# Patient Record
Sex: Male | Born: 1950 | Race: White | Hispanic: No | Marital: Married | Smoking: Never smoker
Health system: Southern US, Community
[De-identification: ages and names within clinical notes are randomized; demographics above are authoritative.]

## PROBLEM LIST (undated history)

## (undated) DIAGNOSIS — C801 Malignant (primary) neoplasm, unspecified: Secondary | ICD-10-CM

## (undated) DIAGNOSIS — K219 Gastro-esophageal reflux disease without esophagitis: Secondary | ICD-10-CM

## (undated) DIAGNOSIS — M1990 Unspecified osteoarthritis, unspecified site: Secondary | ICD-10-CM

## (undated) DIAGNOSIS — G4730 Sleep apnea, unspecified: Secondary | ICD-10-CM

## (undated) DIAGNOSIS — A159 Respiratory tuberculosis unspecified: Secondary | ICD-10-CM

---

## 1998-08-01 ENCOUNTER — Emergency Department (HOSPITAL_COMMUNITY): Admission: EM | Admit: 1998-08-01 | Discharge: 1998-08-01 | Payer: Self-pay | Admitting: Emergency Medicine

## 1998-08-01 ENCOUNTER — Encounter: Payer: Self-pay | Admitting: Emergency Medicine

## 1999-02-13 HISTORY — PX: KNEE ARTHROSCOPY: SHX127

## 2003-12-10 ENCOUNTER — Encounter (INDEPENDENT_AMBULATORY_CARE_PROVIDER_SITE_OTHER): Payer: Self-pay | Admitting: *Deleted

## 2003-12-10 ENCOUNTER — Ambulatory Visit (HOSPITAL_COMMUNITY): Admission: RE | Admit: 2003-12-10 | Discharge: 2003-12-10 | Payer: Self-pay | Admitting: Gastroenterology

## 2006-07-01 ENCOUNTER — Ambulatory Visit: Payer: Self-pay | Admitting: Internal Medicine

## 2006-07-18 ENCOUNTER — Ambulatory Visit (HOSPITAL_BASED_OUTPATIENT_CLINIC_OR_DEPARTMENT_OTHER): Admission: RE | Admit: 2006-07-18 | Discharge: 2006-07-18 | Payer: Self-pay | Admitting: Internal Medicine

## 2006-07-21 ENCOUNTER — Ambulatory Visit: Payer: Self-pay | Admitting: Internal Medicine

## 2006-08-19 ENCOUNTER — Ambulatory Visit: Payer: Self-pay | Admitting: Internal Medicine

## 2007-06-23 ENCOUNTER — Telehealth: Payer: Self-pay | Admitting: Internal Medicine

## 2007-06-24 DIAGNOSIS — G4733 Obstructive sleep apnea (adult) (pediatric): Secondary | ICD-10-CM | POA: Insufficient documentation

## 2007-06-30 DIAGNOSIS — J309 Allergic rhinitis, unspecified: Secondary | ICD-10-CM | POA: Insufficient documentation

## 2007-07-08 ENCOUNTER — Encounter: Payer: Self-pay | Admitting: Internal Medicine

## 2007-07-15 ENCOUNTER — Encounter: Payer: Self-pay | Admitting: Internal Medicine

## 2007-07-23 ENCOUNTER — Encounter: Payer: Self-pay | Admitting: Internal Medicine

## 2007-10-10 ENCOUNTER — Encounter: Payer: Self-pay | Admitting: Internal Medicine

## 2007-12-22 ENCOUNTER — Encounter: Payer: Self-pay | Admitting: Internal Medicine

## 2008-03-09 ENCOUNTER — Encounter: Payer: Self-pay | Admitting: Internal Medicine

## 2008-08-23 ENCOUNTER — Encounter: Payer: Self-pay | Admitting: Internal Medicine

## 2010-06-27 NOTE — Assessment & Plan Note (Signed)
[facility]                             PULMONARY OFFICE NOTE   NAME:Jason Barber, Jason Barber                      MRN:          [id number]  DATE:08/19/2006                            DOB:          08-18-1950    PULMONARY OFFICE FOLLOWUP   PROBLEMS:  1. Obstructive sleep apnea.  2. Irregular sleep schedule.  3. Status post palatoplasty.  4. History of allergic rhinitis.   HISTORY:  He returns after a sleep study done July 18, 2006, which  demonstrated severe obstructive apnea with an index of 63.4 per hour.  CPAP titrated to 16 CWP but bets control seemed to be at pressures of 11  and 12 CWP for an index of 2.6 to 3.6.  He did not tolerate CPAP well  initially.  He tells me he has not been using Rozerem because he  dislikes pills.  We discussed sleep apnea, went over the graphs from  his study, and discussed treatment alternatives.  He is willing to give  CPAP a trial at home supplemented with temazepam if needed to help the  sleep.   OBJECTIVE:  Weight 267 pounds, BP 104/78, pulse 66, room air saturation  96%.  He seems a little anxious.  Pulse is regular.  There is no tremor.  Nasal airway is not obstructed.   IMPRESSION:  Obstructive sleep apnea, severe.   PLAN:  1. Weight loss.  2. Sleep hygiene reviewed.  3. Try home CPAP 12 CWP.  4. Temazepam 15 mg 1 or 2 at bedtime p.r.n. during the first 2 weeks      of CPAP trial.  5. Schedule return in 1 month, earlier p.r.n.     Clinton D. Maple Hudson, MD, Tonny Bollman, FACP  Electronically Signed    CDY/MedQ  DD: 08/19/2006  DT: 08/20/2006  Job #: [id number]   cc:   Marjory Lies, M.D.

## 2010-06-27 NOTE — Procedures (Signed)
Jason Barber, Jason Barber               ACCOUNT NO.:  [id number]   MEDICAL RECORD NO.:  [id number]          PATIENT TYPE:  OUT   LOCATION:  SLEEP CENTER                 FACILITY:  [facility]   PHYSICIAN:  Clinton D. Maple Hudson, MD, FCCP, FACPDATE OF BIRTH:  1950/03/01   DATE OF STUDY:  07/18/2006                            NOCTURNAL POLYSOMNOGRAM   REFERRING PHYSICIAN:  Clinton D. Young, MD, FCCP, FACP   INDICATIONS FOR PROCEDURE:  Hypersomnia with sleep apnea.   RESULTS:  Epward sleepiness score 17/24, BMI 33, weight 260 pounds.   MEDICATIONS:  No routine medications.   SLEEP ARCHITECTURE:  Total sleep time 342 minutes with sleep efficiency  88%. Stage 1 was 11%; Stage 2, 66%; Stages 3 and 4, 9%. REM 15% of total  sleep time. Sleep latency 6 minutes, REM latency 85 minutes. Awake after  sleep onset 39 minutes. Arousal index 25.1. Rozerem 8 mg taken at  bedtime.   RESPIRATORY DATA:  Split study protocol. Apnea hypopnea index (AHI, RDI)  63.4 obstructive events per hour, indicating severe obstructive sleep  apnea/hypopnea syndrome before CPAP. There were 31 obstructive apneas, 4  central apneas, 1 mixed apnea, and 102 hypopnea's before CPAP. Events  were positional with most recorded while supine and most sleep was  supine. REM AHI 63 per hour. CPAP was titrated to 16 CWP, AHI 5.7 per  hour. Best control was at pressures of 11 and 12 CWP (AHI 2.6 to 3.6.) A  medium comfort Fusion mask was used with heated humidifier. The patient  actively disliked wearing anything on his face.   OXYGEN DATA:  Moderate snoring with oxygen desaturation to a nadir of  84%. After CPAP control, saturation held 94% on room air.   CARDIAC DATA:  Normal sinus rhythm.   MOVEMENT/PARASOMNIA:  Insignificant movement disturbance.   IMPRESSION/RECOMMENDATIONS:  1. Unremarkable sleep architecture for sleep center environment after      8 mg of Rozerem.  2. Severe obstructive sleep apnea/hypopnea syndrome, AHI 63.4 per  hour      with most events and most sleep recorded while supine. Moderate      snoring with oxygen desaturation to a nadir of 84%.  3. CPAP titration to 16 CWP, AHI 5.7 per hour. Best numerical control      seemed available at pressures of 11 to 12      CWP (AHI 2.6 to 3.6.) A medium comfort Fusion mask was chosen with      heated humidifier. The patient's intolerance of CPAP was poor on      initial presentation.      Clinton D. Maple Hudson, MD, [facility], FACP  Diplomate, Biomedical engineer of Sleep Medicine  Electronically Signed     CDY/MEDQ  D:  07/21/2006 11:50:44  T:  07/21/2006 21:26:06  Job:  [id number]

## 2010-06-27 NOTE — Assessment & Plan Note (Signed)
[facility]                             PULMONARY OFFICE NOTE   NAME:Jason Barber, Jason Barber                      MRN:          [id number]  DATE:07/01/2006                            DOB:          [birth year]    SLEEP MEDICINE CONSULTATION:   PROBLEM:  A 60 year old gentleman referred through the courtesy of Dr.  Doristine Counter in sleep medicine consultation, concerned about sleep-disordered  breathing.   HISTORY:  He reports a history of loud, constant snoring and witnessed  apneas.  Palatoplasty provided only temporary help years ago.  Bedtime  around 11 p.m. with very short sleep latency.  He thinks he wakes  several times during the night and shifts position but understands that  it does not matter what position he sleeps in.  He is up around 7 a.m.  Weight fluctuates some.  He has never had a regular stable sleep  schedule.   MEDICATIONS:  None.   No medication allergy.   REVIEW OF SYSTEMS:  Snoring and witnessed apnea.  Some shifting and  tightness of the legs, which he says sometimes is because of pain in the  legs.  Occasional use of sleeping pills.  Nonrestorative sleep.  He will  nap anytime/anywhere if he is sitting quietly.  Some depression.   PAST HISTORY:  1. Septoplasty and palatoplasty by Dr. Haroldine Laws around 1999.  2. Nasal congestion and seasonal allergies.  3. Sleep apnea.   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  He had knee surgery in  2004.   SOCIAL HISTORY:  Occasional alcohol, never coffee.  Denies smoking.  Married.  Worked as a Nurse, adult and now a Photographer.   FAMILY HISTORY:  Mother with emphysema.  Brother with asthma.  Father  loud snorer.   OBJECTIVE:  VITAL SIGNS:  Weight 260 pounds, BP 128/86, pulse 75, room  air saturation 96%.  GENERAL:  This is a tall man, 6 feet 2 inches.  He is alert and seems  comfortable.  NEUROLOGIC:   Appropriate to observation.  HEENT:  Palate spacing 3-4/4.  Nasal airway is not obstructed.  Thyroid  is not prominent.  There is no stridor.  CHEST:  Clear.  CARDIAC:  Heart sounds regular without murmur.  No restlessness or  tremor.   IMPRESSION:  1. Obstructive sleep apnea.  2. Long history of irregular sleep schedule.  3. Status post palatoplasty.  4. History of allergic rhinitis.   PLAN:  We are scheduling a split-protocol sleep study at the [facility]  center, and he will return after that for follow-up.  I have begun  discussion of sleep apnea, sleep hygiene, and his responsibility to stay  alert for driving.   I appreciate the chance to meet him.     Clinton D. Maple Hudson, MD, Tonny Bollman, FACP  Electronically Signed    CDY/MedQ  DD: 07/08/2006  DT: 07/08/2006  Job #: [id number]   cc:   Marjory Lies, M.D.  [facility]

## 2010-06-30 NOTE — Op Note (Signed)
NAMENAVEEN, CLARDY               ACCOUNT NO.:  [id number]   MEDICAL RECORD NO.:  [id number]          PATIENT TYPE:  AMB   LOCATION:  ENDO                         FACILITY:  [facility]   PHYSICIAN:  Anselmo Rod, M.D.  DATE OF BIRTH:  May 25, 1950   DATE OF PROCEDURE:  12/10/2003  DATE OF DISCHARGE:                                 OPERATIVE REPORT   PROCEDURE PERFORMED:  Colonoscopy with biopsies x 2 (cold biopsies).   ENDOSCOPIST:  Charna Elizabeth, M.D.   INSTRUMENT USED:  Olympus video colonoscope.   INDICATIONS FOR PROCEDURE:  The patient is a 60 year old white male with a  history of guaiac positive stools, undergoing screening colonoscopy.  Rule  out colonic polyps, masses, etc.   PREPROCEDURE PREPARATION:  Informed consent was procured from the patient.  The patient was fasted for eight hours prior to the procedure and prepped  with a bottle of magnesium citrate and a gallon of GoLYTELY the night prior  to the procedure.   PREPROCEDURE PHYSICAL:  The patient had stable vital signs.  Neck supple.  Chest clear to auscultation.  S1 and S2 regular.  Abdomen soft with normal  bowel sounds.   DESCRIPTION OF PROCEDURE:  The patient was placed in left lateral decubitus  position and sedated with 75 mg of Demerol and 9 mg of Versed in slow  incremental doses.  Once the patient was adequately sedated and maintained  on low flow oxygen and continuous cardiac monitoring, the Olympus video  colonoscope was advanced from the rectum to the cecum.  The appendicular  orifice and ileocecal valve were clearly visualized and photographed.  A  small patch of erythematous mucosa was biopsied from 30 cm.  A few early  sigmoid diverticula were seen in the left colon.  The rest of the exam was  unrevealing.  Retroflexion in the rectum revealed no abnormalities.   IMPRESSION:  1.  Small patch of erythema biopsied at 30 mL.  2.  Few early sigmoid diverticula.  3.  Otherwise unrevealing colonoscopy to  the terminal ileum.   RECOMMENDATIONS:  1.  Await pathology results.  2.  Avoid all nonsteroidals including aspirin for now.  3.  Outpatient follow-up for repeat guaiacs in two weeks.  4.  A high fiber diet with liberal fluid intake has been advocated.  Further      recommendations will be made in follow-up.       JNM/MEDQ  D:  12/10/2003  T:  12/10/2003  Job:  [id number]   cc:   Marjory Lies, M.D.  [address]  [place]  [place] [address]  Fax: [phone]

## 2016-02-17 DIAGNOSIS — F5102 Adjustment insomnia: Secondary | ICD-10-CM | POA: Diagnosis not present

## 2016-02-17 DIAGNOSIS — K219 Gastro-esophageal reflux disease without esophagitis: Secondary | ICD-10-CM | POA: Diagnosis not present

## 2016-04-17 DIAGNOSIS — Z1211 Encounter for screening for malignant neoplasm of colon: Secondary | ICD-10-CM | POA: Diagnosis not present

## 2016-04-17 DIAGNOSIS — K219 Gastro-esophageal reflux disease without esophagitis: Secondary | ICD-10-CM | POA: Diagnosis not present

## 2016-04-17 DIAGNOSIS — K5730 Diverticulosis of large intestine without perforation or abscess without bleeding: Secondary | ICD-10-CM | POA: Diagnosis not present

## 2016-05-02 DIAGNOSIS — K5730 Diverticulosis of large intestine without perforation or abscess without bleeding: Secondary | ICD-10-CM | POA: Diagnosis not present

## 2016-05-02 DIAGNOSIS — Z1211 Encounter for screening for malignant neoplasm of colon: Secondary | ICD-10-CM | POA: Diagnosis not present

## 2016-06-06 DIAGNOSIS — G4733 Obstructive sleep apnea (adult) (pediatric): Secondary | ICD-10-CM | POA: Diagnosis not present

## 2016-07-06 DIAGNOSIS — G4733 Obstructive sleep apnea (adult) (pediatric): Secondary | ICD-10-CM | POA: Diagnosis not present

## 2016-08-06 DIAGNOSIS — G4733 Obstructive sleep apnea (adult) (pediatric): Secondary | ICD-10-CM | POA: Diagnosis not present

## 2016-08-20 ENCOUNTER — Ambulatory Visit (INDEPENDENT_AMBULATORY_CARE_PROVIDER_SITE_OTHER): Payer: PPO | Admitting: Internal Medicine

## 2016-08-20 ENCOUNTER — Encounter: Payer: Self-pay | Admitting: Internal Medicine

## 2016-08-20 VITALS — BP 134/86 | HR 83 | Ht 74.0 in | Wt 279.4 lb

## 2016-08-20 DIAGNOSIS — G4733 Obstructive sleep apnea (adult) (pediatric): Secondary | ICD-10-CM

## 2016-08-20 DIAGNOSIS — F5101 Primary insomnia: Secondary | ICD-10-CM | POA: Diagnosis not present

## 2016-08-20 MED ORDER — ZOLPIDEM TARTRATE 10 MG PO TABS: 10.0000 mg | ORAL_TABLET | Freq: Every evening | ORAL | 5 refills | Status: DC | PRN

## 2016-08-20 NOTE — Patient Instructions (Signed)
Order DME [facility]- Please raise CPAP pressure to 14, continue mask of choice, supplies, AirView   Dx OSA   Please call as needed. We can change the pressure again if needed.

## 2016-08-20 NOTE — Progress Notes (Signed)
08/20/16-66 year old male with history of obstructive sleep apnea coming to reestablish. Last here in 2012. History of septoplasty without lasting benefit. Loud snoring, witnessed apneas. Nasal congestion and seasonal allergies. 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 Today 279 pounds. NPSG 07/18/06- AHI 63.4/hour, desaturation to 84%, CPAP titration to 16, body weight 260 pounds. Pt is here for a sleep consult. DME: [facility]. Pt already has a CPAP machine but it is not working well. Pt states he needs to have the pressure upped. Pt takes ambien to help him sleep due to the CPAP machine not working. Download 83% 4 hour compliance with CPAP 12/Advanced, AHI 0.5/hour. Machine is new. Fullface mask. Does not use humidifier. He travels with another machine, explaining gaps in this download log. Epworth score 12/24 He complains of difficulty initiating and maintaining sleep. Ambien 1/210 mg tab every night. He thinks he won't sleep without it. Denies heart or lung disease.  Prior to Admission medications   Medication Sig Start Date End Date Taking? Authorizing Provider  esomeprazole (NEXIUM) 20 MG capsule Take 20 mg by mouth 2 (two) times daily. Pt states that most of the time he only takes one 20mg  a day instead of bid.   Yes [provider]  zolpidem (AMBIEN) 10 MG tablet Take 1 tablet (10 mg total) by mouth at bedtime as needed for sleep. 08/20/16  Yes Deneise Lever, MD   No past medical history on file. No past surgical history on file. No family history on file. Social History   Social History  . Marital status: Married    Spouse name: N/A  . Number of children: N/A  . Years of education: N/A   Occupational History  . Not on file.   Social History Main Topics  . Smoking status: Never Smoker  . Smokeless tobacco: Never Used  . Alcohol use Not on file  . Drug use: Unknown  . Sexual activity: Not on file   Other Topics  Concern  . Not on file   Social History Narrative  . No narrative on file   ROS-see HPI   Negative unless "+" Constitutional:    weight loss, night sweats, fevers, chills, fatigue, lassitude. HEENT:    headaches, difficulty swallowing, tooth/dental problems, sore throat,       sneezing, itching, ear ache, nasal congestion, post nasal drip, snoring CV:    chest pain, orthopnea, PND, swelling in lower extremities, anasarca,                                                     dizziness, palpitations Resp:   shortness of breath with exertion or at rest.                productive cough,   non-productive cough, coughing up of blood.              change in color of mucus.  wheezing.   Skin:    rash or lesions. GI:  No-   heartburn, indigestion, abdominal pain, nausea, vomiting, diarrhea,                 change in bowel habits, loss of appetite GU: dysuria, change in color of urine, no urgency or frequency.   flank pain. MS:  joint pain, stiffness, decreased range of motion, back pain. Neuro-     nothing unusual Psych:  change in mood or affect.  depression or anxiety.   memory loss.  OBJ- Physical Exam General- Alert, Oriented, Affect-appropriate, Distress- none acute, + tall Skin- + indurated areas on back of neck/sun exposed Lymphadenopathy- none Head- atraumatic            Eyes- Gross vision intact, PERRLA, conjunctivae and secretions clear            Ears- Hearing, canals-normal            Nose- Clear, no-Septal dev, mucus, polyps, erosion, perforation             Throat- Mallampati IV , mucosa clear , drainage- none, tonsils- atrophic Neck- flexible , trachea midline, no stridor , thyroid nl, carotid no bruit Chest - symmetrical excursion , unlabored           Heart/CV- RRR , no murmur , no gallop  , no rub, nl s1 s2                           - JVD- none , edema- none, stasis changes- none, varices- none           Lung- clear to P&A, wheeze- none, cough- none , dullness-none, rub-  none           Chest wall-  Abd-  Br/ Gen/ Rectal- Not done, not indicated Extrem- cyanosis- none, clubbing, none, atrophy- none, strength- nl Neuro- grossly intact to observation

## 2016-08-22 DIAGNOSIS — G4700 Insomnia, unspecified: Secondary | ICD-10-CM | POA: Insufficient documentation

## 2016-08-22 NOTE — Assessment & Plan Note (Signed)
He is used to taking Ambien, usually 5 mg, he thinks he will sleep without it. We discussed this and began exploring ways to get away from the habit.

## 2016-08-22 NOTE — Assessment & Plan Note (Signed)
Good compliance and control with CPAP. Machine is new. He would like to try a higher pressure although he is getting good control at 12. Plan-increased to 14

## 2016-09-05 DIAGNOSIS — G4733 Obstructive sleep apnea (adult) (pediatric): Secondary | ICD-10-CM | POA: Diagnosis not present

## 2016-09-08 DIAGNOSIS — G4733 Obstructive sleep apnea (adult) (pediatric): Secondary | ICD-10-CM | POA: Diagnosis not present

## 2016-09-20 ENCOUNTER — Telehealth: Payer: Self-pay | Admitting: Internal Medicine

## 2016-09-20 DIAGNOSIS — G4733 Obstructive sleep apnea (adult) (pediatric): Secondary | ICD-10-CM

## 2016-09-20 NOTE — Telephone Encounter (Signed)
Ok order CPAP pressure set back to 12 please   Dx OSA

## 2016-09-20 NOTE — Telephone Encounter (Signed)
CY  Please Advise-  Pt called and stated his new pressure of 14 is not working for him, he states it is hard for him to have a good seal. He wanted to know if he could have his pressure either changed to 12 or set at 12-14

## 2016-09-20 NOTE — Telephone Encounter (Signed)
Called and spoke with pt and he is aware of order sent to [facility] to change the pressure of the cpap to 12.

## 2016-10-06 DIAGNOSIS — G4733 Obstructive sleep apnea (adult) (pediatric): Secondary | ICD-10-CM | POA: Diagnosis not present

## 2016-11-06 DIAGNOSIS — G4733 Obstructive sleep apnea (adult) (pediatric): Secondary | ICD-10-CM | POA: Diagnosis not present

## 2016-11-27 ENCOUNTER — Telehealth: Payer: Self-pay | Admitting: Internal Medicine

## 2016-11-27 NOTE — Telephone Encounter (Signed)
I now have a copy of this CMN and it was faxed to [place] this morning

## 2016-11-27 NOTE — Telephone Encounter (Signed)
Rodena Piety did you receive?

## 2016-11-27 NOTE — Telephone Encounter (Signed)
I have checked Dr. Janee Morn CMN folder and I don't have anything on this patient

## 2016-11-28 NOTE — Telephone Encounter (Signed)
Will route to [facility] for follow up.

## 2016-11-28 NOTE — Telephone Encounter (Signed)
Dr. Annamaria Boots has signed this CMN and it has been faxed to [facility] today

## 2016-12-06 DIAGNOSIS — G4733 Obstructive sleep apnea (adult) (pediatric): Secondary | ICD-10-CM | POA: Diagnosis not present

## 2016-12-11 DIAGNOSIS — G4733 Obstructive sleep apnea (adult) (pediatric): Secondary | ICD-10-CM | POA: Diagnosis not present

## 2017-01-06 DIAGNOSIS — G4733 Obstructive sleep apnea (adult) (pediatric): Secondary | ICD-10-CM | POA: Diagnosis not present

## 2017-02-05 DIAGNOSIS — G4733 Obstructive sleep apnea (adult) (pediatric): Secondary | ICD-10-CM | POA: Diagnosis not present

## 2017-03-08 DIAGNOSIS — G4733 Obstructive sleep apnea (adult) (pediatric): Secondary | ICD-10-CM | POA: Diagnosis not present

## 2017-03-22 DIAGNOSIS — G4733 Obstructive sleep apnea (adult) (pediatric): Secondary | ICD-10-CM | POA: Diagnosis not present

## 2017-04-08 DIAGNOSIS — G4733 Obstructive sleep apnea (adult) (pediatric): Secondary | ICD-10-CM | POA: Diagnosis not present

## 2017-04-22 DIAGNOSIS — F5102 Adjustment insomnia: Secondary | ICD-10-CM | POA: Diagnosis not present

## 2017-04-22 DIAGNOSIS — K219 Gastro-esophageal reflux disease without esophagitis: Secondary | ICD-10-CM | POA: Diagnosis not present

## 2017-05-06 DIAGNOSIS — G4733 Obstructive sleep apnea (adult) (pediatric): Secondary | ICD-10-CM | POA: Diagnosis not present

## 2017-06-06 DIAGNOSIS — G4733 Obstructive sleep apnea (adult) (pediatric): Secondary | ICD-10-CM | POA: Diagnosis not present

## 2017-06-21 DIAGNOSIS — G4733 Obstructive sleep apnea (adult) (pediatric): Secondary | ICD-10-CM | POA: Diagnosis not present

## 2017-08-20 ENCOUNTER — Ambulatory Visit: Payer: PPO | Admitting: Internal Medicine

## 2017-09-26 DIAGNOSIS — G4733 Obstructive sleep apnea (adult) (pediatric): Secondary | ICD-10-CM | POA: Diagnosis not present

## 2017-11-19 DIAGNOSIS — K219 Gastro-esophageal reflux disease without esophagitis: Secondary | ICD-10-CM | POA: Diagnosis not present

## 2017-11-19 DIAGNOSIS — B009 Herpesviral infection, unspecified: Secondary | ICD-10-CM | POA: Diagnosis not present

## 2017-11-19 DIAGNOSIS — F5102 Adjustment insomnia: Secondary | ICD-10-CM | POA: Diagnosis not present

## 2018-01-01 DIAGNOSIS — G4733 Obstructive sleep apnea (adult) (pediatric): Secondary | ICD-10-CM | POA: Diagnosis not present

## 2018-02-26 DIAGNOSIS — Z0000 Encounter for general adult medical examination without abnormal findings: Secondary | ICD-10-CM | POA: Diagnosis not present

## 2018-04-07 DIAGNOSIS — G4733 Obstructive sleep apnea (adult) (pediatric): Secondary | ICD-10-CM | POA: Diagnosis not present

## 2018-06-12 DIAGNOSIS — G4733 Obstructive sleep apnea (adult) (pediatric): Secondary | ICD-10-CM | POA: Diagnosis not present

## 2018-09-10 DIAGNOSIS — G4733 Obstructive sleep apnea (adult) (pediatric): Secondary | ICD-10-CM | POA: Diagnosis not present

## 2018-11-21 DIAGNOSIS — N529 Male erectile dysfunction, unspecified: Secondary | ICD-10-CM | POA: Diagnosis not present

## 2019-07-20 ENCOUNTER — Ambulatory Visit (INDEPENDENT_AMBULATORY_CARE_PROVIDER_SITE_OTHER): Payer: No Typology Code available for payment source | Admitting: Orthopaedic Surgery

## 2019-07-20 ENCOUNTER — Ambulatory Visit (INDEPENDENT_AMBULATORY_CARE_PROVIDER_SITE_OTHER): Payer: No Typology Code available for payment source

## 2019-07-20 VITALS — Ht 74.0 in | Wt 230.0 lb

## 2019-07-20 DIAGNOSIS — M1611 Unilateral primary osteoarthritis, right hip: Secondary | ICD-10-CM | POA: Insufficient documentation

## 2019-07-20 DIAGNOSIS — M25551 Pain in right hip: Secondary | ICD-10-CM

## 2019-07-20 DIAGNOSIS — M25559 Pain in unspecified hip: Secondary | ICD-10-CM | POA: Diagnosis not present

## 2019-07-20 NOTE — Progress Notes (Signed)
Office Visit Note   Patient: Jason Barber           Date of Birth: 05/24/1950           MRN: [id number] Visit Date: 07/20/2019              Requested by: No referring provider defined for this encounter. PCP: Patient, No Pcp Per   Assessment & Plan: Visit Diagnoses:  1. Hip pain   2. Unilateral primary osteoarthritis, right hip     Plan: At this point given the severity of his right hip pain and the detrimental effect this is having on his mobility and his quality of life, he does wish to proceed with hip replacement surgery.  Also given his x-ray and clinical exam findings I agree with this.  We talked in detail about what the surgery involves.  I described his interoperative and postoperative course.  I gave him a handout about hip replacement surgery.  We went over his x-rays and a hip replacement model.  We talked about the risk and benefits of surgery in detail.  All questions and concerns were answered and addressed.  He is interested in having this scheduled in the near future.  Follow-Up Instructions: Return for 2 weeks post-op.   Orders:  Orders Placed This Encounter  Procedures  . XR HIP UNILAT W OR W/O PELVIS 1V RIGHT   No orders of the defined types were placed in this encounter.     Procedures: No procedures performed   Clinical Data: No additional findings.   Subjective: Chief Complaint  Patient presents with  . Right Hip - Pain  The patient is a very pleasant and active 69 year old gentleman referred from the [place] system to evaluate and treat severe arthritis involving his right hip.  He has been having worsening hip pain for several years.  Has had several injections in the hip joint and on the side of the hip.  At this point he cannot sleep due to pain.  He reports severe pain in the groin.  It has been worsening.  At this point it is 10 out of 10 daily and is definitely affecting his mobility, his quality of life, and his activities daily living.  He  does walk with a significant limp.  He is a Therapist, nutritional.  Pivoting activities cause severe pain.  Has been sitting for a while and gets to stand up or get out of the car he has severe pain in the groin and is having problems mobilizing.  He is work on activity modification and weight loss.  He takes anti-inflammatories as needed and occasionally has walk with a cane in his opposite hand.  This is been going on for several years.  HPI  Review of Systems He currently denies any headache, chest pain, shortness of breath, fever, chills, nausea, vomiting  Objective: Vital Signs: Ht 6\' 2"  (1.88 m)   Wt 230 lb (104.3 kg)   BMI 29.53 kg/m   Physical Exam He is alert and orient x3 and in no acute distress Ortho Exam  examination of his left hip is normal, examination of his right hip shows severe pain with internal and external rotation. He walks with a Trendelenburg gait and has significant stiffness with attempts of rotating of his hip. Specialty Comments:  No specialty comments available.  Imaging: XR HIP UNILAT W OR W/O PELVIS 1V RIGHT  Result Date: 07/20/2019 An AP pelvis and lateral right hip shows some joint space narrowing  as well as flattening of the superior lateral femoral head.  There is also para-articular osteophytes.  There is cystic changes in the head as well.    PMFS History: Patient Active Problem List   Diagnosis Date Noted  . Unilateral primary osteoarthritis, right hip 07/20/2019  . Insomnia 08/22/2016  . ALLERGIC RHINITIS 06/30/2007  . Obstructive sleep apnea 06/24/2007   No past medical history on file.  No family history on file.   Social History   Occupational History  . Not on file  Tobacco Use  . Smoking status: Never Smoker  . Smokeless tobacco: Never Used  Substance and Sexual Activity  . Alcohol use: Not on file  . Drug use: Not on file  . Sexual activity: Not on file

## 2019-07-24 ENCOUNTER — Other Ambulatory Visit: Payer: Self-pay

## 2019-08-05 ENCOUNTER — Other Ambulatory Visit: Payer: Self-pay | Admitting: Physician Assistant

## 2019-08-06 NOTE — Patient Instructions (Addendum)
DUE TO COVID-19 ONLY ONE VISITOR IS ALLOWED TO COME WITH YOU AND STAY IN THE WAITING ROOM ONLY DURING PRE OP AND PROCEDURE DAY OF SURGERY. THE 2 VISITORS  MAY VISIT WITH YOU AFTER SURGERY IN YOUR PRIVATE ROOM DURING VISITING HOURS ONLY!  YOU NEED TO HAVE A COVID 19 TEST ON_6/29______ @__11 :55_____, THIS TEST MUST BE DONE BEFORE SURGERY, COME  [address],  [place] , [address].  ([place]) ONCE YOUR COVID TEST IS COMPLETED, PLEASE BEGIN THE QUARANTINE INSTRUCTIONS AS OUTLINED IN YOUR HANDOUT.                Jason Barber    Your procedure is scheduled on: 08/14/19   Report to [facility] Main  Entrance   Report to admitting at  6:00 AM     Call this number if you have problems the morning of surgery [phone]   . BRUSH YOUR TEETH MORNING OF SURGERY AND RINSE YOUR MOUTH OUT, NO CHEWING GUM CANDY OR MINTS.   Do not eat food After Midnight.   YOU MAY HAVE CLEAR LIQUIDS FROM MIDNIGHT UNTIL 5:30 AM.   At 5:30 AM Please finish the prescribed Pre-Surgery  drink.   Nothing by mouth after you finish the  drink !   Take these medicines the morning of surgery with A SIP OF WATER: none                                 You may not have any metal on your body including              piercings  Do not wear jewelry,  lotions, powders or deodorant              Men may shave face and neck.   Do not bring valuables to the hospital. [place].  Contacts, dentures or bridgework may not be worn into surgery.                 Please read over the following fact sheets you were given: _____________________________________________________________________             [facility] - Preparing for Surgery Before surgery, you can play an important role.   Because skin is not sterile, your skin needs to be as free of germs as possible.   You can reduce the number of germs on your skin by washing with CHG  (chlorahexidine gluconate) soap before surgery.   CHG is an antiseptic cleaner which kills germs and bonds with the skin to continue killing germs even after washing. Please DO NOT use if you have an allergy to CHG or antibacterial soaps.   If your skin becomes reddened/irritated stop using the CHG and inform your nurse when you arrive at Short Stay.   You may shave your face/neck . Please follow these instructions carefully:  1.  Shower with CHG Soap the night before surgery and the  morning of Surgery.  2.  If you choose to wash your hair, wash your hair first as usual with your  normal  shampoo.  3.  After you shampoo, rinse your hair and body thoroughly to remove the  shampoo.  4.  Use CHG as you would any other liquid soap.  You can apply chg directly  to the skin and wash                       Gently with a scrungie or clean washcloth.  5.  Apply the CHG Soap to your body ONLY FROM THE NECK DOWN.   Do not use on face/ open                           Wound or open sores. Avoid contact with eyes, ears mouth and genitals (private parts).                       Wash face,  Genitals (private parts) with your normal soap.             6.  Wash thoroughly, paying special attention to the area where your surgery  will be performed.  7.  Thoroughly rinse your body with warm water from the neck down.  8.  DO NOT shower/wash with your normal soap after using and rinsing off  the CHG Soap.             9.  Pat yourself dry with a clean towel.            10.  Wear clean pajamas.            11.  Place clean sheets on your bed the night of your first shower and do not  sleep with pets. Day of Surgery : Do not apply any lotions/deodorants the morning of surgery.  Please wear clean clothes to the hospital/surgery center.  FAILURE TO FOLLOW THESE INSTRUCTIONS MAY RESULT IN THE CANCELLATION OF YOUR SURGERY PATIENT SIGNATURE_________________________________  NURSE  SIGNATURE__________________________________  ________________________________________________________________________   Jason Barber  An incentive spirometer is a tool that can help keep your lungs clear and active. This tool measures how well you are filling your lungs with each breath. Taking long deep breaths may help reverse or decrease the chance of developing breathing (pulmonary) problems (especially infection) following:  A long period of time when you are unable to move or be active. BEFORE THE PROCEDURE   If the spirometer includes an indicator to show your best effort, your nurse or respiratory therapist will set it to a desired goal.  If possible, sit up straight or lean slightly forward. Try not to slouch.  Hold the incentive spirometer in an upright position. INSTRUCTIONS FOR USE  1. Sit on the edge of your bed if possible, or sit up as far as you can in bed or on a chair. 2. Hold the incentive spirometer in an upright position. 3. Breathe out normally. 4. Place the mouthpiece in your mouth and seal your lips tightly around it. 5. Breathe in slowly and as deeply as possible, raising the piston or the ball toward the top of the column. 6. Hold your breath for 3-5 seconds or for as long as possible. Allow the piston or ball to fall to the bottom of the column. 7. Remove the mouthpiece from your mouth and breathe out normally. 8. Rest for a few seconds and repeat Steps 1 through 7 at least 10 times every 1-2 hours when you are awake. Take your time and take a few normal breaths between deep breaths. 9. The spirometer may include an indicator to show your best effort.  Use the indicator as a goal to work toward during each repetition. 10. After each set of 10 deep breaths, practice coughing to be sure your lungs are clear. If you have an incision (the cut made at the time of surgery), support your incision when coughing by placing a pillow or rolled up towels firmly  against it. Once you are able to get out of bed, walk around indoors and cough well. You may stop using the incentive spirometer when instructed by your caregiver.  RISKS AND COMPLICATIONS  Take your time so you do not get dizzy or light-headed.  If you are in pain, you may need to take or ask for pain medication before doing incentive spirometry. It is harder to take a deep breath if you are having pain. AFTER USE  Rest and breathe slowly and easily.  It can be helpful to keep track of a log of your progress. Your caregiver can provide you with a simple table to help with this. If you are using the spirometer at home, follow these instructions: [place] IF:   You are having difficultly using the spirometer.  You have trouble using the spirometer as often as instructed.  Your pain medication is not giving enough relief while using the spirometer.  You develop fever of 100.5 F (38.1 C) or higher. SEEK IMMEDIATE MEDICAL CARE IF:   You cough up bloody sputum that had not been present before.  You develop fever of 102 F (38.9 C) or greater.  You develop worsening pain at or near the incision site. MAKE SURE YOU:   Understand these instructions.  Will watch your condition.  Will get help right away if you are not doing well or get worse. Document Released: 06/11/2006 Document Revised: 04/23/2011 Document Reviewed: 08/12/2006 [facility] Patient Information 2014 [place], [place].   ________________________________________________________________________

## 2019-08-07 ENCOUNTER — Other Ambulatory Visit: Payer: Self-pay

## 2019-08-07 ENCOUNTER — Encounter (HOSPITAL_COMMUNITY): Admission: RE | Admit: 2019-08-07 | Payer: No Typology Code available for payment source | Source: Ambulatory Visit

## 2019-08-07 ENCOUNTER — Encounter (HOSPITAL_COMMUNITY)
Admission: RE | Admit: 2019-08-07 | Discharge: 2019-08-07 | Disposition: A | Discharge status: Home / Self Care | Payer: No Typology Code available for payment source | Source: Ambulatory Visit | Attending: Orthopaedic Surgery | Admitting: Orthopaedic Surgery

## 2019-08-07 ENCOUNTER — Encounter (HOSPITAL_COMMUNITY): Payer: Self-pay

## 2019-08-07 DIAGNOSIS — Z01812 Encounter for preprocedural laboratory examination: Secondary | ICD-10-CM | POA: Diagnosis not present

## 2019-08-07 HISTORY — DX: Malignant (primary) neoplasm, unspecified: C80.1

## 2019-08-07 HISTORY — DX: Unspecified osteoarthritis, unspecified site: M19.90

## 2019-08-07 NOTE — Progress Notes (Signed)
COVID Vaccine Completed:no Date COVID Vaccine completed: COVID vaccine manufacturer: [place]   PCP- J. Williams PA -  Cardiologist - no  Chest x-ray - no EKG - no Stress Test -no  ECHO - no Cardiac Cath - no  Sleep Study - yes CPAP - no- Pt had wt loss and no longer needs it  Fasting Blood Sugar - NA Checks Blood Sugar _____ times a day  Blood Thinner Instructions:NA Aspirin Instructions: Last Dose:  Anesthesia review:   Patient denies shortness of breath, fever, cough and chest pain at PAT appointment yes   Patient verbalized understanding of instructions that were given to them at the PAT appointment. Patient was also instructed that they will need to review over the PAT instructions again at home before surgery. Yes Pt reports that he walks 3 times a week is active and has no SOB with ADLs.

## 2019-08-10 ENCOUNTER — Other Ambulatory Visit: Payer: Self-pay

## 2019-08-10 ENCOUNTER — Encounter (HOSPITAL_COMMUNITY)
Admission: RE | Admit: 2019-08-10 | Discharge: 2019-08-10 | Disposition: A | Discharge status: Home / Self Care | Payer: No Typology Code available for payment source | Source: Ambulatory Visit | Attending: Orthopaedic Surgery | Admitting: Orthopaedic Surgery

## 2019-08-10 DIAGNOSIS — Z01812 Encounter for preprocedural laboratory examination: Secondary | ICD-10-CM | POA: Diagnosis not present

## 2019-08-10 LAB — ABO/RH: ABO/RH(D): O POS

## 2019-08-10 LAB — CBC
HCT: 45.8 % (ref 39.0–52.0)
Hemoglobin: 15.6 g/dL (ref 13.0–17.0)
MCH: 32.7 pg (ref 26.0–34.0)
MCHC: 34.1 g/dL (ref 30.0–36.0)
MCV: 96 fL (ref 80.0–100.0)
Platelets: 116 10*3/uL — ABNORMAL LOW (ref 150–400)
RBC: 4.77 MIL/uL (ref 4.22–5.81)
RDW: 13.1 % (ref 11.5–15.5)
WBC: 4.5 10*3/uL (ref 4.0–10.5)
nRBC: 0 % (ref 0.0–0.2)

## 2019-08-11 ENCOUNTER — Other Ambulatory Visit (HOSPITAL_COMMUNITY): Payer: No Typology Code available for payment source

## 2019-08-13 ENCOUNTER — Other Ambulatory Visit (HOSPITAL_COMMUNITY)
Admission: RE | Admit: 2019-08-13 | Discharge: 2019-08-13 | Disposition: A | Discharge status: Home / Self Care | Payer: No Typology Code available for payment source | Source: Ambulatory Visit | Attending: Orthopaedic Surgery | Admitting: Orthopaedic Surgery

## 2019-08-13 LAB — SARS CORONAVIRUS 2 (TAT 6-24 HRS): SARS Coronavirus 2: NEGATIVE

## 2019-08-13 NOTE — Anesthesia Preprocedure Evaluation (Addendum)
Anesthesia Evaluation  Patient identified by MRN, date of birth, ID band Patient awake    Reviewed: Allergy & Precautions, NPO status , Patient's Chart, lab work & pertinent test results  Airway Mallampati: III  TM Distance: >3 FB Neck ROM: Full  Mouth opening: Limited Mouth Opening  Dental no notable dental hx. (+) Teeth Intact, Dental Advisory Given   Pulmonary sleep apnea (pt denies, no CPAP) ,    Pulmonary exam normal breath sounds clear to auscultation       Cardiovascular negative cardio ROS Normal cardiovascular exam Rhythm:Regular Rate:Normal     Neuro/Psych negative neurological ROS  negative psych ROS   GI/Hepatic negative GI ROS, Neg liver ROS,   Endo/Other  negative endocrine ROS  Renal/GU negative Renal ROS  negative genitourinary   Musculoskeletal  (+) Arthritis ,   Abdominal   Peds  Hematology  (+) Blood dyscrasia (plt 116), ,   Anesthesia Other Findings   Reproductive/Obstetrics                            Anesthesia Physical Anesthesia Plan  ASA: II  Anesthesia Plan: Spinal   Post-op Pain Management:    Induction:   PONV Risk Score and Plan: Treatment may vary due to age or medical condition, Propofol infusion and Midazolam  Airway Management Planned: Natural Airway  Additional Equipment:   Intra-op Plan:   Post-operative Plan:   Informed Consent: I have reviewed the patients History and Physical, chart, labs and discussed the procedure including the risks, benefits and alternatives for the proposed anesthesia with the patient or authorized representative who has indicated his/her understanding and acceptance.     Dental advisory given  Plan Discussed with: CRNA  Anesthesia Plan Comments:         Anesthesia Quick Evaluation

## 2019-08-13 NOTE — Progress Notes (Signed)
Patient aware to arrive at 0515am for 0715am surgery.  Patient also aware to drink preop drink by 0415am and clear liquids until 0415am.

## 2019-08-13 NOTE — H&P (Signed)
TOTAL HIP ADMISSION H&P  Patient is admitted for right total hip arthroplasty.  Subjective:  Chief Complaint: right hip pain  HPI: Jason Barber, 69 y.o. male, has a history of pain and functional disability in the right hip(s) due to arthritis and patient has failed non-surgical conservative treatments for greater than 12 weeks to include NSAID's and/or analgesics, corticosteriod injections, flexibility and strengthening excercises, use of assistive devices and activity modification.  Onset of symptoms was gradual starting 5 years ago with gradually worsening course since that time.The patient noted no past surgery on the right hip(s).  Patient currently rates pain in the right hip at 10 out of 10 with activity. Patient has night pain, worsening of pain with activity and weight bearing, trendelenberg gait, pain that interfers with activities of daily living and pain with passive range of motion. Patient has evidence of subchondral cysts, subchondral sclerosis, periarticular osteophytes and joint space narrowing by imaging studies. This condition presents safety issues increasing the risk of falls.  There is no current active infection.  Patient Active Problem List   Diagnosis Date Noted  . Unilateral primary osteoarthritis, right hip 07/20/2019  . Insomnia 08/22/2016  . ALLERGIC RHINITIS 06/30/2007  . Obstructive sleep apnea 06/24/2007   Past Medical History:  Diagnosis Date  . Arthritis    Hips and knees  . Cancer [facility])    lt chest 4/21    Past Surgical History:  Procedure Laterality Date  . KNEE ARTHROSCOPY Right 2001    No current facility-administered medications for this encounter.   Current Outpatient Medications  Medication Sig Dispense Refill Last Dose  . gabapentin (NEURONTIN) 300 MG capsule Take 300 mg by mouth at bedtime.     . Liniments (ANALGESIC/ARTHRITIC/CAPSAICIN EX) Apply 1 application topically 4 (four) times daily as needed (hip/knee pain.). [place] Maximum  Strength Pain Relieving Cream     . naproxen (NAPROSYN) 500 MG tablet Take 500 mg by mouth 2 (two) times daily as needed (pain.).     Marland Kitchen traZODone (DESYREL) 150 MG tablet Take 150 mg by mouth at bedtime.      No Known Allergies  Social History   Tobacco Use  . Smoking status: Never Smoker  . Smokeless tobacco: Never Used  Substance Use Topics  . Alcohol use: Yes    Comment: occational    No family history on file.   Review of Systems  All other systems reviewed and are negative.   Objective:  Physical Exam Vitals reviewed.  Constitutional:      Appearance: Normal appearance.  HENT:     Head: Normocephalic and atraumatic.  Eyes:     Pupils: Pupils are equal, round, and reactive to light.  Cardiovascular:     Rate and Rhythm: Normal rate and regular rhythm.     Pulses: Normal pulses.  Pulmonary:     Effort: Pulmonary effort is normal.     Breath sounds: Normal breath sounds.  Abdominal:     Palpations: Abdomen is soft.  Musculoskeletal:     Cervical back: Normal range of motion.     Right hip: Tenderness present. Decreased range of motion. Decreased strength.  Neurological:     Mental Status: He is alert and oriented to person, place, and time.     Vital signs in last 24 hours:    Labs:   Estimated body mass index is 29.4 kg/m as calculated from the following:   Height as of 08/10/19: 6\' 2"  (1.88 m).   Weight as of  08/10/19: 103.9 kg.   Imaging Review Plain radiographs demonstrate severe degenerative joint disease of the right hip(s). The bone quality appears to be good for age and reported activity level.      Assessment/Plan:  End stage arthritis, right hip(s)  The patient history, physical examination, clinical judgement of the provider and imaging studies are consistent with end stage degenerative joint disease of the right hip(s) and total hip arthroplasty is deemed medically necessary. The treatment options including medical management, injection  therapy, arthroscopy and arthroplasty were discussed at length. The risks and benefits of total hip arthroplasty were presented and reviewed. The risks due to aseptic loosening, infection, stiffness, dislocation/subluxation,  thromboembolic complications and other imponderables were discussed.  The patient acknowledged the explanation, agreed to proceed with the plan and consent was signed. Patient is being admitted for inpatient treatment for surgery, pain control, PT, OT, prophylactic antibiotics, VTE prophylaxis, progressive ambulation and ADL's and discharge planning.The patient is planning to be discharged home with home health services

## 2019-08-14 ENCOUNTER — Encounter (HOSPITAL_COMMUNITY)
Admission: RE | Disposition: A | Discharge status: Home / Self Care | Payer: Self-pay | Source: Home / Self Care | Attending: Orthopaedic Surgery

## 2019-08-14 ENCOUNTER — Inpatient Hospital Stay (HOSPITAL_COMMUNITY): Payer: No Typology Code available for payment source

## 2019-08-14 ENCOUNTER — Inpatient Hospital Stay (HOSPITAL_COMMUNITY): Payer: No Typology Code available for payment source | Admitting: Certified Registered Nurse Anesthetist

## 2019-08-14 ENCOUNTER — Other Ambulatory Visit: Payer: Self-pay

## 2019-08-14 ENCOUNTER — Encounter (HOSPITAL_COMMUNITY): Payer: Self-pay | Admitting: Orthopaedic Surgery

## 2019-08-14 ENCOUNTER — Inpatient Hospital Stay (HOSPITAL_COMMUNITY)
Admission: RE | Admit: 2019-08-14 | Discharge: 2019-08-15 | DRG: 470 | Disposition: A | Discharge status: Home / Self Care | Payer: No Typology Code available for payment source | Attending: Orthopedic Surgery | Admitting: Orthopedic Surgery

## 2019-08-14 DIAGNOSIS — M25751 Osteophyte, right hip: Secondary | ICD-10-CM | POA: Diagnosis present

## 2019-08-14 DIAGNOSIS — Z96651 Presence of right artificial knee joint: Secondary | ICD-10-CM

## 2019-08-14 DIAGNOSIS — M1611 Unilateral primary osteoarthritis, right hip: Secondary | ICD-10-CM | POA: Diagnosis not present

## 2019-08-14 DIAGNOSIS — Z96641 Presence of right artificial hip joint: Secondary | ICD-10-CM

## 2019-08-14 DIAGNOSIS — Z20822 Contact with and (suspected) exposure to covid-19: Secondary | ICD-10-CM | POA: Diagnosis present

## 2019-08-14 DIAGNOSIS — Z79899 Other long term (current) drug therapy: Secondary | ICD-10-CM | POA: Diagnosis not present

## 2019-08-14 DIAGNOSIS — G4733 Obstructive sleep apnea (adult) (pediatric): Secondary | ICD-10-CM | POA: Diagnosis not present

## 2019-08-14 DIAGNOSIS — Z419 Encounter for procedure for purposes other than remedying health state, unspecified: Secondary | ICD-10-CM

## 2019-08-14 HISTORY — PX: TOTAL HIP ARTHROPLASTY: SHX124

## 2019-08-14 LAB — TYPE AND SCREEN
ABO/RH(D): O POS
Antibody Screen: NEGATIVE

## 2019-08-14 SURGERY — ARTHROPLASTY, HIP, TOTAL, ANTERIOR APPROACH
Anesthesia: Spinal | Site: Hip | Laterality: Right

## 2019-08-14 MED ORDER — SODIUM CHLORIDE 0.9 % IV SOLN: INTRAVENOUS | Status: DC

## 2019-08-14 MED ORDER — EPHEDRINE 5 MG/ML INJ
INTRAVENOUS | Status: AC
  Filled 2019-08-14: qty 10

## 2019-08-14 MED ORDER — OXYCODONE HCL 5 MG PO TABS
5.0000 mg | ORAL_TABLET | ORAL | Status: DC | PRN
  Administered 2019-08-14: 5 mg via ORAL
  Administered 2019-08-15: 10 mg via ORAL
  Filled 2019-08-14: qty 1
  Filled 2019-08-14: qty 2

## 2019-08-14 MED ORDER — TRANEXAMIC ACID-NACL 1000-0.7 MG/100ML-% IV SOLN
1000.0000 mg | INTRAVENOUS | Status: AC
  Administered 2019-08-14: 1000 mg via INTRAVENOUS
  Filled 2019-08-14: qty 100

## 2019-08-14 MED ORDER — ORAL CARE MOUTH RINSE: 15.0000 mL | Freq: Once | OROMUCOSAL | Status: AC

## 2019-08-14 MED ORDER — DIPHENHYDRAMINE HCL 12.5 MG/5ML PO ELIX: 12.5000 mg | ORAL_SOLUTION | ORAL | Status: DC | PRN

## 2019-08-14 MED ORDER — PANTOPRAZOLE SODIUM 40 MG PO TBEC
40.0000 mg | DELAYED_RELEASE_TABLET | Freq: Every day | ORAL | Status: DC
  Administered 2019-08-14 – 2019-08-15 (×2): 40 mg via ORAL
  Filled 2019-08-14 (×2): qty 1

## 2019-08-14 MED ORDER — ALUM & MAG HYDROXIDE-SIMETH 200-200-20 MG/5ML PO SUSP: 30.0000 mL | ORAL | Status: DC | PRN

## 2019-08-14 MED ORDER — ONDANSETRON HCL 4 MG/2ML IJ SOLN: 4.0000 mg | Freq: Four times a day (QID) | INTRAMUSCULAR | Status: DC | PRN

## 2019-08-14 MED ORDER — ONDANSETRON HCL 4 MG PO TABS: 4.0000 mg | ORAL_TABLET | Freq: Four times a day (QID) | ORAL | Status: DC | PRN

## 2019-08-14 MED ORDER — METOCLOPRAMIDE HCL 5 MG/ML IJ SOLN: 5.0000 mg | Freq: Three times a day (TID) | INTRAMUSCULAR | Status: DC | PRN

## 2019-08-14 MED ORDER — DEXAMETHASONE SODIUM PHOSPHATE 10 MG/ML IJ SOLN
INTRAMUSCULAR | Status: AC
  Filled 2019-08-14: qty 1

## 2019-08-14 MED ORDER — MIDAZOLAM HCL 2 MG/2ML IJ SOLN
INTRAMUSCULAR | Status: AC
  Filled 2019-08-14: qty 2

## 2019-08-14 MED ORDER — ASPIRIN 81 MG PO CHEW
81.0000 mg | CHEWABLE_TABLET | Freq: Two times a day (BID) | ORAL | Status: DC
  Administered 2019-08-14 – 2019-08-15 (×2): 81 mg via ORAL
  Filled 2019-08-14 (×2): qty 1

## 2019-08-14 MED ORDER — FENTANYL CITRATE (PF) 100 MCG/2ML IJ SOLN
INTRAMUSCULAR | Status: DC | PRN
  Administered 2019-08-14 (×2): 50 ug via INTRAVENOUS

## 2019-08-14 MED ORDER — EPHEDRINE SULFATE-NACL 50-0.9 MG/10ML-% IV SOSY
PREFILLED_SYRINGE | INTRAVENOUS | Status: DC | PRN
  Administered 2019-08-14: 7.5 mg via INTRAVENOUS
  Administered 2019-08-14: 5 mg via INTRAVENOUS

## 2019-08-14 MED ORDER — LACTATED RINGERS IV SOLN: INTRAVENOUS | Status: DC

## 2019-08-14 MED ORDER — CHLORHEXIDINE GLUCONATE 0.12 % MT SOLN
15.0000 mL | Freq: Once | OROMUCOSAL | Status: AC
  Administered 2019-08-14: 15 mL via OROMUCOSAL

## 2019-08-14 MED ORDER — METHOCARBAMOL 500 MG PO TABS
500.0000 mg | ORAL_TABLET | Freq: Four times a day (QID) | ORAL | Status: DC | PRN
  Administered 2019-08-14 (×2): 500 mg via ORAL
  Filled 2019-08-14 (×3): qty 1

## 2019-08-14 MED ORDER — MENTHOL 3 MG MT LOZG: 1.0000 | LOZENGE | OROMUCOSAL | Status: DC | PRN

## 2019-08-14 MED ORDER — ACETAMINOPHEN 325 MG PO TABS: 325.0000 mg | ORAL_TABLET | Freq: Four times a day (QID) | ORAL | Status: DC | PRN

## 2019-08-14 MED ORDER — TRAZODONE HCL 50 MG PO TABS
150.0000 mg | ORAL_TABLET | Freq: Every day | ORAL | Status: DC
  Administered 2019-08-14: 150 mg via ORAL
  Filled 2019-08-14: qty 1

## 2019-08-14 MED ORDER — STERILE WATER FOR IRRIGATION IR SOLN
Status: DC | PRN
  Administered 2019-08-14: 2000 mL

## 2019-08-14 MED ORDER — OXYCODONE HCL 5 MG PO TABS: 10.0000 mg | ORAL_TABLET | ORAL | Status: DC | PRN

## 2019-08-14 MED ORDER — METOCLOPRAMIDE HCL 5 MG PO TABS: 5.0000 mg | ORAL_TABLET | Freq: Three times a day (TID) | ORAL | Status: DC | PRN

## 2019-08-14 MED ORDER — SODIUM CHLORIDE 0.9 % IR SOLN
Status: DC | PRN
  Administered 2019-08-14: 1

## 2019-08-14 MED ORDER — FENTANYL CITRATE (PF) 100 MCG/2ML IJ SOLN
INTRAMUSCULAR | Status: AC
  Filled 2019-08-14: qty 2

## 2019-08-14 MED ORDER — DOCUSATE SODIUM 100 MG PO CAPS
100.0000 mg | ORAL_CAPSULE | Freq: Two times a day (BID) | ORAL | Status: DC
  Administered 2019-08-14 – 2019-08-15 (×3): 100 mg via ORAL
  Filled 2019-08-14 (×3): qty 1

## 2019-08-14 MED ORDER — METHOCARBAMOL 500 MG IVPB - SIMPLE MED
500.0000 mg | Freq: Four times a day (QID) | INTRAVENOUS | Status: DC | PRN
  Filled 2019-08-14: qty 50

## 2019-08-14 MED ORDER — 0.9 % SODIUM CHLORIDE (POUR BTL) OPTIME
TOPICAL | Status: DC | PRN
  Administered 2019-08-14: 1000 mL

## 2019-08-14 MED ORDER — CEFAZOLIN SODIUM-DEXTROSE 2-4 GM/100ML-% IV SOLN
2.0000 g | INTRAVENOUS | Status: AC
  Administered 2019-08-14: 2 g via INTRAVENOUS
  Filled 2019-08-14: qty 100

## 2019-08-14 MED ORDER — BUPIVACAINE HCL (PF) 0.75 % IJ SOLN
INTRAMUSCULAR | Status: DC | PRN
  Administered 2019-08-14: 1.8 mL via INTRATHECAL

## 2019-08-14 MED ORDER — PHENOL 1.4 % MT LIQD: 1.0000 | OROMUCOSAL | Status: DC | PRN

## 2019-08-14 MED ORDER — PROPOFOL 500 MG/50ML IV EMUL
INTRAVENOUS | Status: DC | PRN
  Administered 2019-08-14: 75 ug/kg/min via INTRAVENOUS

## 2019-08-14 MED ORDER — PROPOFOL 1000 MG/100ML IV EMUL
INTRAVENOUS | Status: AC
  Filled 2019-08-14: qty 100

## 2019-08-14 MED ORDER — GABAPENTIN 300 MG PO CAPS
300.0000 mg | ORAL_CAPSULE | Freq: Every day | ORAL | Status: DC
  Administered 2019-08-14: 300 mg via ORAL
  Filled 2019-08-14: qty 1

## 2019-08-14 MED ORDER — SODIUM CHLORIDE 0.9 % IV BOLUS
250.0000 mL | Freq: Once | INTRAVENOUS | Status: AC
  Administered 2019-08-14: 250 mL via INTRAVENOUS

## 2019-08-14 MED ORDER — PROPOFOL 10 MG/ML IV BOLUS
INTRAVENOUS | Status: DC | PRN
  Administered 2019-08-14 (×2): 20 mg via INTRAVENOUS

## 2019-08-14 MED ORDER — ACETAMINOPHEN 500 MG PO TABS
1000.0000 mg | ORAL_TABLET | Freq: Once | ORAL | Status: AC
  Administered 2019-08-14: 1000 mg via ORAL
  Filled 2019-08-14: qty 2

## 2019-08-14 MED ORDER — MIDAZOLAM HCL 5 MG/5ML IJ SOLN
INTRAMUSCULAR | Status: DC | PRN
  Administered 2019-08-14: 2 mg via INTRAVENOUS

## 2019-08-14 MED ORDER — FENTANYL CITRATE (PF) 100 MCG/2ML IJ SOLN
25.0000 ug | INTRAMUSCULAR | Status: DC | PRN
  Administered 2019-08-14: 50 ug via INTRAVENOUS

## 2019-08-14 MED ORDER — CEFAZOLIN SODIUM-DEXTROSE 1-4 GM/50ML-% IV SOLN
1.0000 g | Freq: Four times a day (QID) | INTRAVENOUS | Status: AC
  Administered 2019-08-14 (×2): 1 g via INTRAVENOUS
  Filled 2019-08-14 (×2): qty 50

## 2019-08-14 MED ORDER — HYDROMORPHONE HCL 1 MG/ML IJ SOLN
0.5000 mg | INTRAMUSCULAR | Status: DC | PRN
  Administered 2019-08-14: 1 mg via INTRAVENOUS
  Filled 2019-08-14: qty 1

## 2019-08-14 MED ORDER — POLYETHYLENE GLYCOL 3350 17 G PO PACK: 17.0000 g | PACK | Freq: Every day | ORAL | Status: DC | PRN

## 2019-08-14 MED ORDER — DEXAMETHASONE SODIUM PHOSPHATE 10 MG/ML IJ SOLN
INTRAMUSCULAR | Status: DC | PRN
  Administered 2019-08-14: 5 mg via INTRAVENOUS

## 2019-08-14 MED ORDER — POVIDONE-IODINE 10 % EX SWAB
2.0000 "application " | Freq: Once | CUTANEOUS | Status: AC
  Administered 2019-08-14: 2 via TOPICAL

## 2019-08-14 SURGICAL SUPPLY — 42 items
APL SKNCLS STERI-STRIP NONHPOA (GAUZE/BANDAGES/DRESSINGS)
BAG SPEC THK2 15X12 ZIP CLS (MISCELLANEOUS)
BAG ZIPLOCK 12X15 (MISCELLANEOUS) IMPLANT
BALL HIP ARTICU EZE 36 8.5 (Hips) IMPLANT
BENZOIN TINCTURE PRP APPL 2/3 (GAUZE/BANDAGES/DRESSINGS) IMPLANT
BLADE SAW SGTL 18X1.27X75 (BLADE) ×2 IMPLANT
COVER PERINEAL POST (MISCELLANEOUS) ×2 IMPLANT
COVER SURGICAL LIGHT HANDLE (MISCELLANEOUS) ×2 IMPLANT
COVER WAND RF STERILE (DRAPES) ×2 IMPLANT
CUP ACET PNNCL SECTR W/GRIP 56 (Hips) IMPLANT
DRAPE STERI IOBAN 125X83 (DRAPES) ×2 IMPLANT
DRAPE U-SHAPE 47X51 STRL (DRAPES) ×4 IMPLANT
DRSG AQUACEL AG ADV 3.5X10 (GAUZE/BANDAGES/DRESSINGS) ×2 IMPLANT
DURAPREP 26ML APPLICATOR (WOUND CARE) ×2 IMPLANT
ELECT REM PT RETURN 15FT ADLT (MISCELLANEOUS) ×2 IMPLANT
GAUZE XEROFORM 1X8 LF (GAUZE/BANDAGES/DRESSINGS) ×2 IMPLANT
GLOVE BIO SURGEON STRL SZ7.5 (GLOVE) ×2 IMPLANT
GLOVE BIOGEL PI IND STRL 8 (GLOVE) ×2 IMPLANT
GLOVE BIOGEL PI INDICATOR 8 (GLOVE) ×2
GLOVE ECLIPSE 8.0 STRL XLNG CF (GLOVE) ×2 IMPLANT
GOWN STRL REUS W/TWL XL LVL3 (GOWN DISPOSABLE) ×4 IMPLANT
HANDPIECE INTERPULSE COAX TIP (DISPOSABLE) ×2
HIP BALL ARTICU EZE 36 8.5 (Hips) ×2 IMPLANT
HOLDER FOLEY CATH W/STRAP (MISCELLANEOUS) ×2 IMPLANT
KIT TURNOVER KIT A (KITS) IMPLANT
PACK ANTERIOR HIP CUSTOM (KITS) ×2 IMPLANT
PENCIL SMOKE EVACUATOR (MISCELLANEOUS) IMPLANT
PINN SECTOR W/GRIP ACE CUP 56 (Hips) ×2 IMPLANT
PINNACLE ALTRX PLUS 4 N 36X56 (Hips) ×1 IMPLANT
SET HNDPC FAN SPRY TIP SCT (DISPOSABLE) ×1 IMPLANT
STAPLER VISISTAT 35W (STAPLE) IMPLANT
STEM CORAIL KA15 (Stem) ×1 IMPLANT
STRIP CLOSURE SKIN 1/2X4 (GAUZE/BANDAGES/DRESSINGS) IMPLANT
SUT ETHIBOND NAB CT1 #1 30IN (SUTURE) ×2 IMPLANT
SUT ETHILON 2 0 PS N (SUTURE) IMPLANT
SUT MNCRL AB 4-0 PS2 18 (SUTURE) IMPLANT
SUT VIC AB 0 CT1 36 (SUTURE) ×2 IMPLANT
SUT VIC AB 1 CT1 36 (SUTURE) ×2 IMPLANT
SUT VIC AB 2-0 CT1 27 (SUTURE) ×4
SUT VIC AB 2-0 CT1 TAPERPNT 27 (SUTURE) ×2 IMPLANT
TRAY FOLEY MTR SLVR 16FR STAT (SET/KITS/TRAYS/PACK) IMPLANT
YANKAUER SUCT BULB TIP 10FT TU (MISCELLANEOUS) ×2 IMPLANT

## 2019-08-14 NOTE — Evaluation (Signed)
Physical Therapy Evaluation Patient Details Name: Jason Barber MRN: [id number] DOB: 12/12/1950 Today's Date: 08/14/2019   History of Present Illness  Patient is 69 y.o. male s/p Rt THA anterior approach with PMH significant for OA, cancer.    Clinical Impression  Jason Barber is a 69 y.o. male POD 0 s/p Rt THA. Patient reports independence with mobility at baseline. Patient is now limited by functional impairments (see PT problem list below) and requires min assist for transfers and gait with RW. Patient was able to ambulate ~75 feet with RW and min assist. Patient instructed in exercise to facilitate ROM and circulation. Patient will benefit from continued skilled PT interventions to address impairments and progress towards PLOF. Acute PT will follow to progress mobility and stair training in preparation for safe discharge home.     Follow Up Recommendations Follow surgeon's recommendation for DC plan and follow-up therapies;Home health PT    Equipment Recommendations  Rolling walker with 5" wheels;3in1 (PT) (delivered to room day of surgery)    Recommendations for Other Services       Precautions / Restrictions Precautions Precautions: Fall Restrictions Weight Bearing Restrictions: No Other Position/Activity Restrictions: WBAT      Mobility  Bed Mobility Overal bed mobility: Needs Assistance Bed Mobility: Supine to Sit     Supine to sit: Supervision     General bed mobility comments: no assist required, cues to use bead rail.   Transfers Overall transfer level: Needs assistance Equipment used: Rolling walker (2 wheeled) Transfers: Sit to/from Stand Sit to Stand: Min assist         General transfer comment: cues for technique with RW, assist for power up and to stedy with rise.  Ambulation/Gait Ambulation/Gait assistance: Min assist Gait Distance (Feet): 75 Feet Assistive device: Rolling walker (2 wheeled) Gait Pattern/deviations: Step-to pattern Gait  velocity: decreased   General Gait Details: cues for safe step pattern and proximity to RW. no overt LOB noted.  Stairs     Wheelchair Mobility    Modified Rankin (Stroke Patients Only)       Balance Overall balance assessment: Needs assistance Sitting-balance support: Feet supported Sitting balance-Leahy Scale: Good     Standing balance support: During functional activity;Bilateral upper extremity supported Standing balance-Leahy Scale: Fair              Pertinent Vitals/Pain Pain Assessment: 0-10 Pain Score: 2  Pain Location: Rt hip Pain Descriptors / Indicators: Aching;Discomfort Pain Intervention(s): Limited activity within patient's tolerance;Repositioned;Ice applied;Monitored during session    Home Living Family/patient expects to be discharged to:: Private residence Living Arrangements: Alone Available Help at Discharge: Family;Friend(s) Type of Home: House Home Access: Stairs to enter Entrance Stairs-Rails: None Entrance Stairs-Number of Steps: 3 Home Layout: One level Home Equipment: Environmental consultant - 2 wheels;Bedside commode (delievered to room)      Prior Function Level of Independence: Independent         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     Hand Dominance   Dominant Hand: Right    Extremity/Trunk Assessment   Upper Extremity Assessment Upper Extremity Assessment: Overall WFL for tasks assessed    Lower Extremity Assessment Lower Extremity Assessment: Overall WFL for tasks assessed    Cervical / Trunk Assessment Cervical / Trunk Assessment: Normal  Communication   Communication: No difficulties  Cognition Arousal/Alertness: Awake/alert Behavior During Therapy: WFL for tasks assessed/performed Overall Cognitive Status: Within Functional Limits for tasks assessed  General Comments      Exercises Total Joint Exercises Ankle Circles/Pumps: AROM;Both;20 reps;Seated Quad Sets: AROM;Right;10 reps;Seated Heel Slides:  AROM;Right;10 reps;Seated   Assessment/Plan    PT Assessment Patient needs continued PT services  PT Problem List Decreased strength;Decreased activity tolerance;Decreased range of motion;Decreased balance;Decreased mobility;Decreased knowledge of use of DME       PT Treatment Interventions DME instruction;Gait training;Stair training;Functional mobility training;Therapeutic exercise;Therapeutic activities;Balance training;Patient/family education    PT Goals (Current goals can be found in the Care Plan section)  Acute Rehab PT Goals Patient Stated Goal: get home and back to independence PT Goal Formulation: With patient Time For Goal Achievement: 08/21/19 Potential to Achieve Goals: Good    Frequency 7X/week    AM-PAC PT "6 Clicks" Mobility  Outcome Measure Help needed turning from your back to your side while in a flat bed without using bedrails?: None Help needed moving from lying on your back to sitting on the side of a flat bed without using bedrails?: A Little Help needed moving to and from a bed to a chair (including a wheelchair)?: A Little Help needed standing up from a chair using your arms (e.g., wheelchair or bedside chair)?: A Little Help needed to walk in hospital room?: A Little Help needed climbing 3-5 steps with a railing? : A Little 6 Click Score: 19    End of Session Equipment Utilized During Treatment: Gait belt Activity Tolerance: Patient tolerated treatment well Patient left: in chair;with call bell/phone within reach;with chair alarm set Nurse Communication: Mobility status PT Visit Diagnosis: Muscle weakness (generalized) (M62.81);Difficulty in walking, not elsewhere classified (R26.2)    Time: 6440-3474 PT Time Calculation (min) (ACUTE ONLY): 18 min   Charges:   PT Evaluation $PT Eval Low Complexity: 1 Low          Wynn Maudlin, DPT Acute Rehabilitation Services  Office [phone] Pager [phone]  08/14/2019 5:37 PM

## 2019-08-14 NOTE — Brief Op Note (Signed)
08/14/2019  8:34 AM  PATIENT:  Jason Barber  69 y.o. male  PRE-OPERATIVE DIAGNOSIS:  osteoarthritis right hip  POST-OPERATIVE DIAGNOSIS:  osteoarthritis right hip  PROCEDURE:  Procedure(s): RIGHT TOTAL HIP ARTHROPLASTY ANTERIOR APPROACH (Right)  SURGEON:  Surgeon(s) and Role:    Mcarthur Rossetti, MD - Primary  ASSISTANTS:  Leila, RNFA   ANESTHESIA:   spinal  EBL:  200 mL   COUNTS:  YES  DICTATION: .Other Dictation: Dictation Number [id number]  PLAN OF CARE: Admit for overnight observation  PATIENT DISPOSITION:  PACU - hemodynamically stable.   Delay start of Pharmacological VTE agent (>24hrs) due to surgical blood loss or risk of bleeding: no

## 2019-08-14 NOTE — Anesthesia Postprocedure Evaluation (Signed)
Anesthesia Post Note  Patient: Jason Barber  Procedure(s) Performed: RIGHT TOTAL HIP ARTHROPLASTY ANTERIOR APPROACH (Right Hip)     Patient location during evaluation: PACU Anesthesia Type: Spinal Level of consciousness: oriented and awake and alert Pain management: pain level controlled Vital Signs Assessment: post-procedure vital signs reviewed and stable Respiratory status: spontaneous breathing, respiratory function stable and patient connected to nasal cannula oxygen Cardiovascular status: blood pressure returned to baseline and stable Postop Assessment: no headache, no backache and no apparent nausea or vomiting Anesthetic complications: no   No complications documented.  Last Vitals:  Vitals:   08/14/19 1030 08/14/19 1207  BP: (!) 154/87 (!) 145/85  Pulse: (!) 57 62  Resp: 14 17  Temp:    SpO2: 100% 100%    Last Pain:  Vitals:   08/14/19 1105  TempSrc:   PainSc: 4                  Phelan Schadt L Elika Godar

## 2019-08-14 NOTE — Interval H&P Note (Signed)
History and Physical Interval Note: The patient understands fully that he is here for a right total hip arthroplasty.  There has been no acute changes in medical status.  See recent H&P.  The risk and benefits of surgery and been explained in detail and informed consent is obtained.  The right hip marked.  09/15/8348 7:57 AM  Jason Barber  has presented today for surgery, with the diagnosis of osteoarthritis right hip.  The various methods of treatment have been discussed with the patient and family. After consideration of risks, benefits and other options for treatment, the patient has consented to  Procedure(s): RIGHT TOTAL HIP ARTHROPLASTY ANTERIOR APPROACH (Right) as a surgical intervention.  The patient's history has been reviewed, patient examined, no change in status, stable for surgery.  I have reviewed the patient's chart and labs.  Questions were answered to the patient's satisfaction.     Mcarthur Rossetti

## 2019-08-14 NOTE — Transfer of Care (Signed)
Immediate Anesthesia Transfer of Care Note  Patient: Jason Barber  Procedure(s) Performed: RIGHT TOTAL HIP ARTHROPLASTY ANTERIOR APPROACH (Right Hip)  Patient Location: PACU  Anesthesia Type:Spinal  Level of Consciousness: awake, alert , oriented and patient cooperative  Airway & Oxygen Therapy: Patient Spontanous Breathing and Patient connected to face mask  Post-op Assessment: Report given to RN and Post -op Vital signs reviewed and stable  Post vital signs: Reviewed and stable  Last Vitals:  Vitals Value Taken Time  BP    Temp    Pulse    Resp    SpO2      Last Pain:  Vitals:   08/14/19 0609  TempSrc: Oral  PainSc:       Patients Stated Pain Goal: 3 (41/28/20 8138)  Complications: No complications documented.

## 2019-08-14 NOTE — Op Note (Signed)
Jason, Barber MEDICAL RECORD [id number] ACCOUNT [id number] DATE OF BIRTH:January 05, 1951 FACILITY: [facility] LOCATION: [facility] PHYSICIAN:Horton Ellithorpe Aretha Parrot, MD  OPERATIVE REPORT  DATE OF PROCEDURE:  08/14/2019  PREOPERATIVE DIAGNOSIS:  Primary osteoarthritis and degenerative joint disease, right hip.  POSTOPERATIVE DIAGNOSIS:  Primary osteoarthritis and degenerative joint disease, right hip.  PROCEDURE:  Right total hip arthroplasty through direct anterior approach.  IMPLANTS:  [employer] Gription acetabular component size 56, size 36+4 neutral polyethylene liner, size 15 Corail femoral component with standard offset, size 36+8.5 metal hip ball.  SURGEON:  Vanita Panda.  Magnus Ivan, MD  ASSISTANTJackelyn Hoehn, RNFA.  ANTIBIOTICS:  Two g IV Ancef.  ANESTHESIA:  Spinal.  ESTIMATED BLOOD LOSS:  200 mL.  COMPLICATIONS:  None.  INDICATIONS:  The patient is a very pleasant 70 year old veteran, well known to me.  He has debilitating arthritis involving his right hip.  At this point, he has tried and failed all forms of conservative treatment and does wish to proceed with a total  hip arthroplasty on the right side.  We had a long and thorough discussion about this.  His arthritis is severe and it is detrimentally affecting his mobility, his quality of life and his activities of daily living.  He understands with surgery, there is  risk of acute blood loss anemia, nerve or vessel injury, fracture, infection, dislocation, DVT and implant failure.  He understands our goals are to decrease pain, improve mobility and overall improve quality of life.  DESCRIPTION OF PROCEDURE:  After informed consent was obtained and appropriate right hip was marked.  He was brought to the operating room and sat up on a stretcher where spinal anesthesia was then obtained.  A Foley catheter was placed.  He was laid in  supine position.  Traction boots were placed on both his feet.  He was placed supine on the  Hana fracture table, the perineal post in place and both legs in line skeletal traction device and no traction applied.  His right operative hip was prepped and  draped with DuraPrep and sterile drapes.  A time-out was called.  He was identified as correct patient and correct right hip.  I then made an incision just inferior and posterior to the anterior superior iliac spine and carried this obliquely down the  leg.  Dissected down to the tensor fascia lata muscle.  Tensor fascia was then divided longitudinally to proceed with direct anterior approach to the hip.  We identified and cauterized circumflex vessels and identified the hip capsule, opened the hip  capsule in an L-type format, finding a moderate joint effusion and significant osteophytes around the femoral head and neck.  With Cobra retractors placed within the joint capsule around the medial and lateral femoral neck, we made our femoral neck cut  with an oscillating saw just proximal to the lesser trochanter and completed this with an osteotome.  We placed a corkscrew guide in the femoral head and removed the femoral head in its entirety and found a wide area devoid of cartilage.  I then placed a  bent Hohmann over the medial acetabular rim and removed remnants of the acetabular labrum and other debris.  I then began reaming under direct visualization from a size 44 reamer in stepwise increments up to a size 55, with all reamers under direct  visualization, the last reamer was placed under direct fluoroscopy so I could obtain my depth of reaming my inclination and anteversion.  I then placed the real [employer]  Gription acetabular component size 56 and a 36+4 polyethylene liner.  Attention  was then turned to the femur where the leg externally rotated to 120 degrees, extended and adducted.  We were able to place a Mueller retractor medially and a Hohmann retractor behind the greater trochanter.  I released the lateral joint capsule and used  a  box-cutting osteotome to enter the femoral canal and a rongeur to lateralize.  I then began broaching using the Corail broaching system from a size 8 going all the way to a size 15.  With the size 15 in place, we trialed standard offset femoral neck  and a 36+1.5 hip ball, reduced this in the acetabulum and it was stable.  We definitely needed more offset and leg length.   We dislocated the hip and removed the trial components.  We placed the real Corail femoral component size 15 with standard  offset.  We went with a 36+8.5 metal hip ball.  We reduced this in the acetabulum and I was pleased with leg length, offset, range of motion and stability assessed radiographically and mechanically.  We then irrigated the soft tissue with normal saline  solution.  We dried the hip real well and then closed the joint capsule with interrupted #1 Ethibond suture, #1 Vicryl was used to close the tensor fascia, 0 Vicryl was used to close deep tissue and 2-0 Vicryl was used to close subcutaneous tissue.  The  skin was reapproximated with staples.  Xeroform, Aquacel dressing was applied.  He was taken off the Hana table and taken to the recovery room in stable condition.  All final counts were correct.  There were no complications noted.  VN/NUANCE  D:08/14/2019 T:08/14/2019 JOB:[id number]

## 2019-08-14 NOTE — Anesthesia Procedure Notes (Signed)
Spinal  Patient location during procedure: OR Start time: 08/14/2019 7:18 AM End time: 08/14/2019 7:28 AM Staffing Performed: anesthesiologist  Anesthesiologist: Freddrick March, MD Preanesthetic Checklist Completed: patient identified, IV checked, risks and benefits discussed, surgical consent, monitors and equipment checked, pre-op evaluation and timeout performed Spinal Block Patient position: sitting Prep: DuraPrep and site prepped and draped Patient monitoring: cardiac monitor, continuous pulse ox and blood pressure Approach: midline Location: L3-4 Injection technique: single-shot Needle Needle type: Pencan  Needle gauge: 24 G Needle length: 9 cm Assessment Sensory level: T6 Additional Notes Functioning IV was confirmed and monitors were applied. Sterile prep and drape, including hand hygiene and sterile gloves were used. The patient was positioned and the spine was prepped. The skin was anesthetized with lidocaine.  Free flow of clear CSF was obtained prior to injecting local anesthetic into the CSF.  The spinal needle aspirated freely following injection.  The needle was carefully withdrawn.  The patient tolerated the procedure well.

## 2019-08-14 NOTE — TOC Progression Note (Signed)
Transition of Care [facility]) - Progression Note    Patient Details  Name: Jason Barber MRN: [id number] Date of Birth: 02-17-1950  [facility] of [facility] CM/SW Contact  Lennart Pall, LCSW Phone Number: 08/14/2019, 1:17 PM  Clinical Narrative: Met with pt this morning to review d/c needs.  HH and DME ordered (see below).  Continue to follow.        Barriers to Discharge: No Barriers Identified  Expected Discharge Plan and Services                           DME Arranged: 3-N-1, Walker rolling DME Agency: [employer] Date DME Agency Contacted: 08/14/19 Time DME Agency Contacted: 1711 Representative spoke with at DME Agency: [place]: PT [place]: [place] Date [place]: 08/14/19 Time [place]: [place] Representative spoke with at [place]: [place] ([place]) Interventions    Readmission Risk Interventions No flowsheet data found.

## 2019-08-15 LAB — BASIC METABOLIC PANEL
Anion gap: 7 (ref 5–15)
BUN: 12 mg/dL (ref 8–23)
CO2: 27 mmol/L (ref 22–32)
Calcium: 8.3 mg/dL — ABNORMAL LOW (ref 8.9–10.3)
Chloride: 103 mmol/L (ref 98–111)
Creatinine, Ser: 0.87 mg/dL (ref 0.61–1.24)
GFR calc Af Amer: >60 mL/min (ref >60)
GFR calc non Af Amer: >60 mL/min (ref >60)
Glucose, Bld: 115 mg/dL — ABNORMAL HIGH (ref 70–99)
Potassium: 3.9 mmol/L (ref 3.5–5.1)
Sodium: 137 mmol/L (ref 135–145)

## 2019-08-15 LAB — CBC
HCT: 38.6 % — ABNORMAL LOW (ref 39.0–52.0)
Hemoglobin: 13.1 g/dL (ref 13.0–17.0)
MCH: 32 pg (ref 26.0–34.0)
MCHC: 33.9 g/dL (ref 30.0–36.0)
MCV: 94.1 fL (ref 80.0–100.0)
Platelets: 110 10*3/uL — ABNORMAL LOW (ref 150–400)
RBC: 4.1 MIL/uL — ABNORMAL LOW (ref 4.22–5.81)
RDW: 12.3 % (ref 11.5–15.5)
WBC: 7.9 10*3/uL (ref 4.0–10.5)
nRBC: 0 % (ref 0.0–0.2)

## 2019-08-15 MED ORDER — OXYCODONE-ACETAMINOPHEN 5-325 MG PO TABS: 1.0000 | ORAL_TABLET | ORAL | 0 refills | Status: DC | PRN

## 2019-08-15 NOTE — Plan of Care (Signed)
  Problem: Activity: Goal: Risk for activity intolerance will decrease Outcome: Progressing   Problem: Nutrition: Goal: Adequate nutrition will be maintained Outcome: Progressing   Problem: Pain Managment: Goal: General experience of comfort will improve Outcome: Progressing   Problem: Education: Goal: Knowledge of the prescribed therapeutic regimen will improve Outcome: Progressing

## 2019-08-15 NOTE — Progress Notes (Addendum)
Physical Therapy Treatment Patient Details Name: Jason Barber MRN: [id number] DOB: 1950/12/19 Today's Date: 08/15/2019    History of Present Illness Patient is 69 y.o. male s/p Rt THA anterior approach with PMH significant for OA, cancer.      PT Comments    Progressing with mobility. Educated pt on importance of safety. Reviewed/practiced exercises and gait training. All education completed. Pt is eager to d/c home!   Follow Up Recommendations  Follow surgeon's recommendation for DC plan and follow-up therapies     Equipment Recommendations  Rolling walker with 5" wheels;3in1 (PT)    Recommendations for Other Services       Precautions / Restrictions Precautions Precautions: Fall Restrictions Weight Bearing Restrictions: No Other Position/Activity Restrictions: WBAT    Mobility  Bed Mobility Overal bed mobility: Modified Independent                Transfers Overall transfer level: Needs assistance Equipment used: Rolling walker (2 wheeled) Transfers: Sit to/from Stand Sit to Stand: Supervision         General transfer comment: VCs safety, hand placement.  Ambulation/Gait Ambulation/Gait assistance: Min guard Gait Distance (Feet): 150 Feet (x2) Assistive device: Rolling walker (2 wheeled) Gait Pattern/deviations: Step-to pattern;Step-through pattern;Decreased stride length;Antalgic     General Gait Details: Gait is mildly antalgic. Cues for safety. Pt denied dizziness. BP 103/64 after walking (see orthostatics in flowsheets)   Stairs Stairs: Yes Stairs assistance: Min guard Stair Management: Step to pattern;Forwards;Two rails Number of Stairs: 5 General stair comments: up and over portable stairs x 2. VCs safety, technique, sequence.   Wheelchair Mobility    Modified Rankin (Stroke Patients Only)       Balance Overall balance assessment: Needs assistance         Standing balance support: Bilateral upper extremity supported Standing  balance-Leahy Scale: Fair                              Cognition Arousal/Alertness: Awake/alert Behavior During Therapy: WFL for tasks assessed/performed Overall Cognitive Status: Within Functional Limits for tasks assessed                                        Exercises Total Joint Exercises Ankle Circles/Pumps: AROM;Both;10 reps Quad Sets: AROM;Both;10 reps Heel Slides: Right;10 reps;AROM Hip ABduction/ADduction: AROM;Right;10 reps    General Comments        Pertinent Vitals/Pain Pain Assessment: 0-10 Pain Score: 2  Pain Location: R hip Pain Descriptors / Indicators: Sore;Aching Pain Intervention(s): Monitored during session    Home Living                      Prior Function            PT Goals (current goals can now be found in the care plan section) Progress towards PT goals: Progressing toward goals    Frequency    7X/week      PT Plan Current plan remains appropriate    Co-evaluation              AM-PAC PT "6 Clicks" Mobility   Outcome Measure  Help needed turning from your back to your side while in a flat bed without using bedrails?: None Help needed moving from lying on your back to sitting on the side of a flat bed without  using bedrails?: None Help needed moving to and from a bed to a chair (including a wheelchair)?: A Little Help needed standing up from a chair using your arms (e.g., wheelchair or bedside chair)?: A Little Help needed to walk in hospital room?: A Little Help needed climbing 3-5 steps with a railing? : A Little 6 Click Score: 20    End of Session Equipment Utilized During Treatment: Gait belt Activity Tolerance: Patient tolerated treatment well Patient left: in bed;with call bell/phone within reach;with bed alarm set   PT Visit Diagnosis: Muscle weakness (generalized) (M62.81);Difficulty in walking, not elsewhere classified (R26.2)     Time: 4098-1191 PT Time Calculation (min)  (ACUTE ONLY): 33 min  Charges:  $Gait Training: 8-22 mins                        Faye Ramsay, PT Acute Rehabilitation  Office: [phone] Pager: [phone]

## 2019-08-15 NOTE — Plan of Care (Signed)
Pt stable at this time. Pt to d/c home.  ?

## 2019-08-15 NOTE — Plan of Care (Signed)
  Problem: Clinical Measurements: Goal: Respiratory complications will improve Outcome: Progressing   Problem: Clinical Measurements: Goal: Cardiovascular complication will be avoided Outcome: Progressing   Problem: Nutrition: Goal: Adequate nutrition will be maintained Outcome: Progressing   Problem: Elimination: Goal: Will not experience complications related to bowel motility Outcome: Progressing   Problem: Skin Integrity: Goal: Risk for impaired skin integrity will decrease Outcome: Progressing   Problem: Pain Managment: Goal: General experience of comfort will improve Outcome: Progressing

## 2019-08-15 NOTE — Discharge Instructions (Signed)

## 2019-08-15 NOTE — Progress Notes (Signed)
Pt stable at time of d/c instructions and education given. Pt dressing, dry, and intact. Rn will continue to monitor.

## 2019-08-15 NOTE — Discharge Summary (Signed)
Discharge Diagnoses:  Principal Problem:   Unilateral primary osteoarthritis, right hip Active Problems:   Status post total replacement of right hip   Surgeries: Procedure(s): RIGHT TOTAL HIP ARTHROPLASTY ANTERIOR APPROACH on 08/14/2019    Consultants:   Discharged Condition: Improved  Hospital Course: Jason Barber is an 69 y.o. male who was admitted 08/14/2019 with a chief complaint of osteoarthritis right hip, with a final diagnosis of osteoarthritis right hip.  Patient was brought to the operating room on 08/14/2019 and underwent Procedure(s): RIGHT TOTAL HIP ARTHROPLASTY ANTERIOR APPROACH.    Patient was given perioperative antibiotics:  Anti-infectives (From admission, onward)   Start     Dose/Rate Route Frequency Ordered Stop   08/14/19 1400  ceFAZolin (ANCEF) IVPB 1 g/50 mL premix        1 g 100 mL/hr over 30 Minutes Intravenous Every 6 hours 08/14/19 1055 08/14/19 2008   08/14/19 0600  ceFAZolin (ANCEF) IVPB 2g/100 mL premix        2 g 200 mL/hr over 30 Minutes Intravenous On call to O.R. 08/14/19 9485 08/14/19 0735    .  Patient was given sequential compression devices, early ambulation, and aspirin for DVT prophylaxis.  Recent vital signs:  Patient Vitals for the past 24 hrs:  BP Temp Temp src Pulse Resp SpO2  08/15/19 1021 112/69 98.1 F (36.7 C) -- 81 17 96 %  08/15/19 0617 138/89 97.9 F (36.6 C) Oral 67 16 99 %  08/15/19 0159 109/71 98.6 F (37 C) Oral 77 16 96 %  08/14/19 2140 137/83 99 F (37.2 C) -- 81 16 97 %  08/14/19 1745 136/90 98.1 F (36.7 C) -- 69 -- --  08/14/19 1614 (!) 83/60 -- -- 60 -- 95 %  08/14/19 1353 (!) 146/81 98 F (36.7 C) -- 73 17 99 %  08/14/19 1308 (!) 163/97 -- -- 64 17 100 %  .  Recent laboratory studies: DG Pelvis Portable  Result Date: 08/14/2019 CLINICAL DATA:  Status post right hip replacement. EXAM: PORTABLE PELVIS 1-2 VIEWS COMPARISON:  July 20, 2019. FINDINGS: The right femoral and acetabular components appear to be  well situated. No dislocation is noted. Expected postoperative changes are noted in the surrounding soft tissues. IMPRESSION: Status post right total hip arthroplasty. Electronically Signed   By: Marijo Conception M.D.   On: 08/14/2019 10:58   DG C-Arm 1-60 Min-No Report  Result Date: 08/14/2019 Fluoroscopy was utilized by the requesting physician.  No radiographic interpretation.   DG HIP OPERATIVE UNILAT W OR W/O PELVIS RIGHT  Result Date: 08/14/2019 CLINICAL DATA:  Total right hip replacement. EXAM: OPERATIVE RIGHT HIP (WITH PELVIS IF PERFORMED) 5 VIEWS TECHNIQUE: Fluoroscopic spot image(s) were submitted for interpretation post-operatively. COMPARISON:  07/20/2019 FINDINGS: Total right hip replacement. Hardware intact. Anatomic alignment. No acute bony abnormality identified. No evidence of fracture or dislocation. IMPRESSION: Total right hip replacement.  Hardware intact.  Anatomic alignment. Electronically Signed   By: Marcello Moores  Register   On: 08/14/2019 08:50    Discharge Medications:   Allergies as of 08/15/2019   No Known Allergies     Medication List    TAKE these medications   ANALGESIC/ARTHRITIC/CAPSAICIN EX Apply 1 application topically 4 (four) times daily as needed (hip/knee pain.). [place] Maximum Strength Pain Relieving Cream   gabapentin 300 MG capsule Commonly known as: NEURONTIN Take 300 mg by mouth at bedtime.   naproxen 500 MG tablet Commonly known as: NAPROSYN Take 500 mg by mouth 2 (two) times  daily as needed (pain.).   oxyCODONE-acetaminophen 5-325 MG tablet Commonly known as: PERCOCET/ROXICET Take 1 tablet by mouth every 4 (four) hours as needed for severe pain.   oxyCODONE-acetaminophen 5-325 MG tablet Commonly known as: PERCOCET/ROXICET Take 1 tablet by mouth every 4 (four) hours as needed for severe pain.   traZODone 150 MG tablet Commonly known as: DESYREL Take 150 mg by mouth at bedtime.            Durable Medical Equipment  (From admission,  onward)         Start     Ordered   08/14/19 1056  DME Walker rolling  Once       Question Answer Comment  Walker: With 5 Inch Wheels   Patient needs a walker to treat with the following condition Status post total replacement of right hip      08/14/19 1055   08/14/19 1056  DME 3 n 1  Once        08/14/19 1055          Diagnostic Studies: DG Pelvis Portable  Result Date: 08/14/2019 CLINICAL DATA:  Status post right hip replacement. EXAM: PORTABLE PELVIS 1-2 VIEWS COMPARISON:  July 20, 2019. FINDINGS: The right femoral and acetabular components appear to be well situated. No dislocation is noted. Expected postoperative changes are noted in the surrounding soft tissues. IMPRESSION: Status post right total hip arthroplasty. Electronically Signed   By: Marijo Conception M.D.   On: 08/14/2019 10:58   DG C-Arm 1-60 Min-No Report  Result Date: 08/14/2019 Fluoroscopy was utilized by the requesting physician.  No radiographic interpretation.   DG HIP OPERATIVE UNILAT W OR W/O PELVIS RIGHT  Result Date: 08/14/2019 CLINICAL DATA:  Total right hip replacement. EXAM: OPERATIVE RIGHT HIP (WITH PELVIS IF PERFORMED) 5 VIEWS TECHNIQUE: Fluoroscopic spot image(s) were submitted for interpretation post-operatively. COMPARISON:  07/20/2019 FINDINGS: Total right hip replacement. Hardware intact. Anatomic alignment. No acute bony abnormality identified. No evidence of fracture or dislocation. IMPRESSION: Total right hip replacement.  Hardware intact.  Anatomic alignment. Electronically Signed   By: Marcello Moores  Register   On: 08/14/2019 08:50   XR HIP UNILAT W OR W/O PELVIS 1V RIGHT  Result Date: 07/20/2019 An AP pelvis and lateral right hip shows some joint space narrowing as well as flattening of the superior lateral femoral head.  There is also para-articular osteophytes.  There is cystic changes in the head as well.   Patient benefited maximally from their hospital stay and there were no complications.      Disposition: Discharge disposition: 01-Home or Self Care      Discharge Instructions    Call MD / Call 911   Complete by: As directed    If you experience chest pain or shortness of breath, CALL 911 and be transported to the hospital emergency room.  If you develope a fever above 101 F, pus (white drainage) or increased drainage or redness at the wound, or calf pain, call your surgeon's office.   Constipation Prevention   Complete by: As directed    Drink plenty of fluids.  Prune juice may be helpful.  You may use a stool softener, such as Colace (over the counter) 100 mg twice a day.  Use MiraLax (over the counter) for constipation as needed.   Diet - low sodium heart healthy   Complete by: As directed    Increase activity slowly as tolerated   Complete by: As directed  Follow-up Information    Mcarthur Rossetti, MD Follow up in 2 week(s).   Specialty: Orthopedic Surgery Contact information: [address] [place] [address] [phone]                Signed: Newt Minion 08/15/2019, 12:50 PM

## 2019-08-18 ENCOUNTER — Encounter (HOSPITAL_COMMUNITY): Payer: Self-pay | Admitting: Orthopaedic Surgery

## 2019-08-19 ENCOUNTER — Telehealth: Payer: Self-pay | Admitting: Orthopaedic Surgery

## 2019-08-19 NOTE — Telephone Encounter (Signed)
Patient aware he may drive if he's not on pain medication

## 2019-08-19 NOTE — Telephone Encounter (Signed)
Patient called. He would like to know if he can drive.

## 2019-08-27 ENCOUNTER — Ambulatory Visit (INDEPENDENT_AMBULATORY_CARE_PROVIDER_SITE_OTHER): Payer: No Typology Code available for payment source | Admitting: Orthopaedic Surgery

## 2019-08-27 ENCOUNTER — Other Ambulatory Visit: Payer: Self-pay

## 2019-08-27 ENCOUNTER — Encounter: Payer: Self-pay | Admitting: Orthopaedic Surgery

## 2019-08-27 DIAGNOSIS — Z96641 Presence of right artificial hip joint: Secondary | ICD-10-CM

## 2019-08-27 NOTE — Progress Notes (Signed)
The patient will be 2 weeks tomorrow status post a right hip replacement.  He reports that he is doing well overall.  He is already driving since is not taking narcotics.  He is not walking with assistive device at all.  He is already been dancing.  On exam remove the staples in place Steri-Strips that his right hip incision.  There is some swelling but no significant seroma.  His leg lengths are equal.  He will continue his aspirin daily for 1 more week and he can stop his aspirin.  He is requesting to get in a hot tub at some point a total weight at least 2 more weeks to allow good healing of his incision.  All question concerns were answered and addressed.  We will see him in follow-up in 4 weeks.  No x-rays are needed.

## 2019-09-10 ENCOUNTER — Inpatient Hospital Stay: Payer: PPO | Admitting: Orthopaedic Surgery

## 2019-09-24 ENCOUNTER — Ambulatory Visit (INDEPENDENT_AMBULATORY_CARE_PROVIDER_SITE_OTHER): Payer: PPO | Admitting: Orthopaedic Surgery

## 2019-09-24 ENCOUNTER — Encounter: Payer: Self-pay | Admitting: Orthopaedic Surgery

## 2019-09-24 DIAGNOSIS — Z96641 Presence of right artificial hip joint: Secondary | ICD-10-CM

## 2019-09-24 NOTE — Progress Notes (Signed)
The patient is now 6 weeks status post a right total hip arthroplasty.  He says he is doing very well overall and has improved motion and strength.  He is not needing to use an assistive device when he gets around.  Is a very young appearing and active 69 year old.  On up with his right operative hip the range of motion he has no pain at all.  His leg lengths are equal.  At this point will continue increase his activities as comfort allows.  I will see him back in 6 months with a standing AP pelvis and lateral of his right operative hip.

## 2020-03-29 ENCOUNTER — Ambulatory Visit: Payer: PPO | Admitting: Orthopaedic Surgery

## 2020-12-13 DIAGNOSIS — Z0000 Encounter for general adult medical examination without abnormal findings: Secondary | ICD-10-CM | POA: Diagnosis not present

## 2021-01-11 IMAGING — RF DG HIP (WITH PELVIS) OPERATIVE*R*
1 series · 5 of 5 positions shown · non-contrast
Comparison: 07/20/2019

CLINICAL DATA: Total right hip replacement.

EXAM:
OPERATIVE RIGHT HIP (WITH PELVIS IF PERFORMED) 5 VIEWS
TECHNIQUE: Fluoroscopic spot image(s) were submitted for interpretation
post-operatively.

[Series 1: unknown protocol · 0.20mm/px · 5 of 5 slices shown]
[im 1/5]
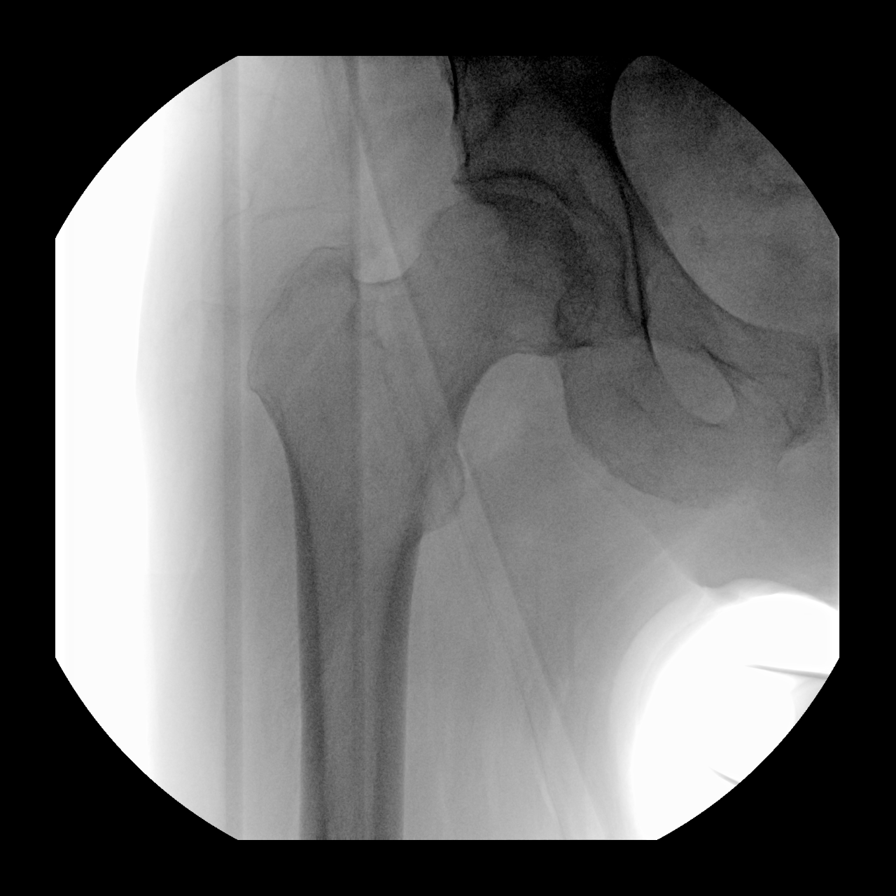
[im 2/5]
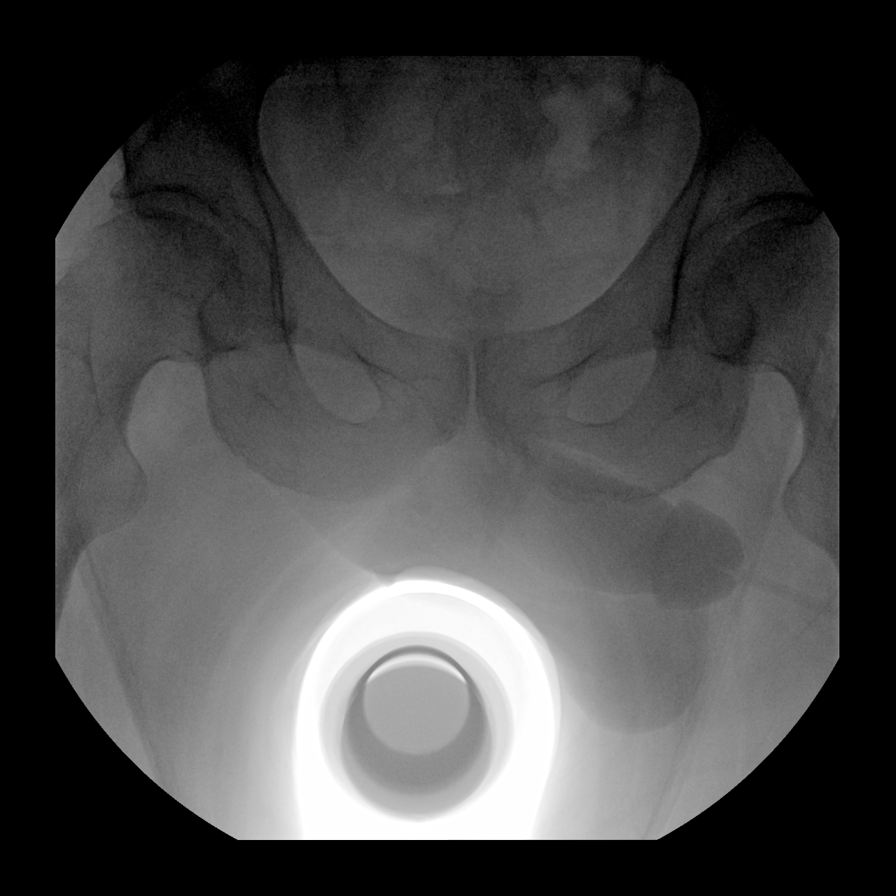
[im 3/5]
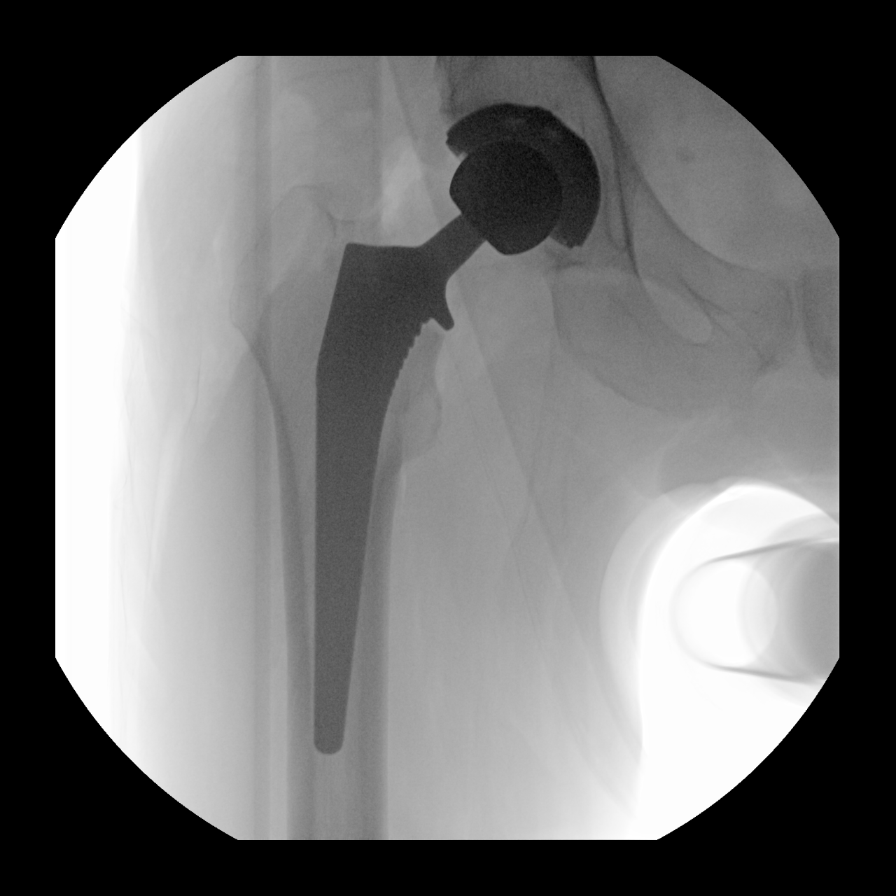
[im 4/5]
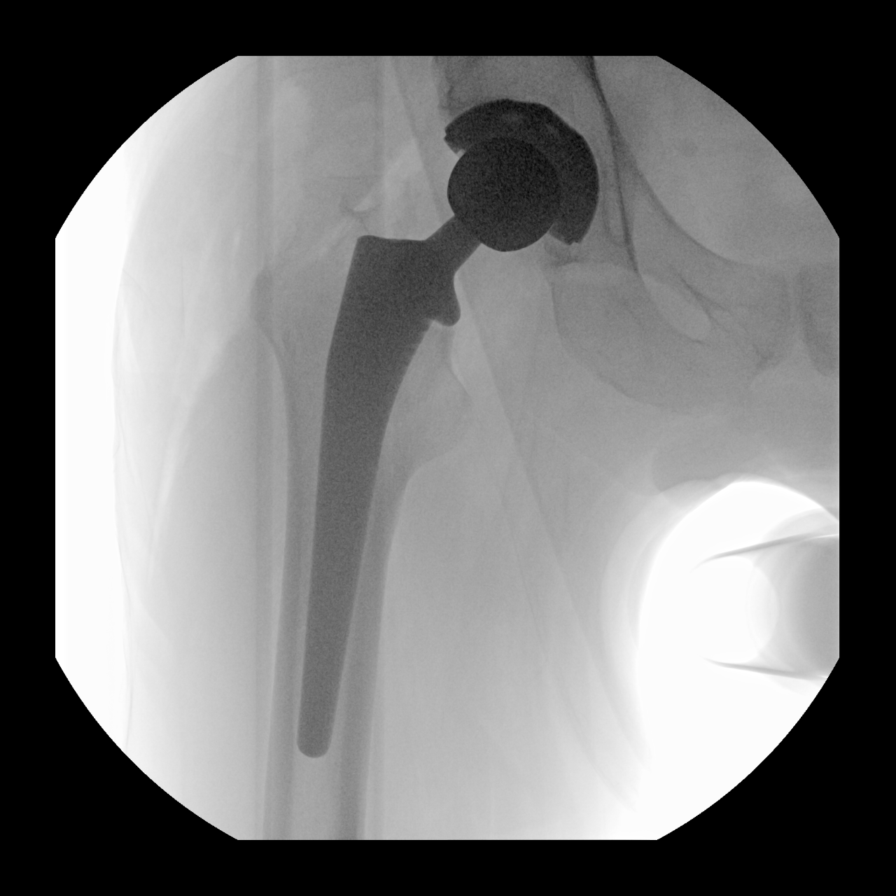
[im 5/5]
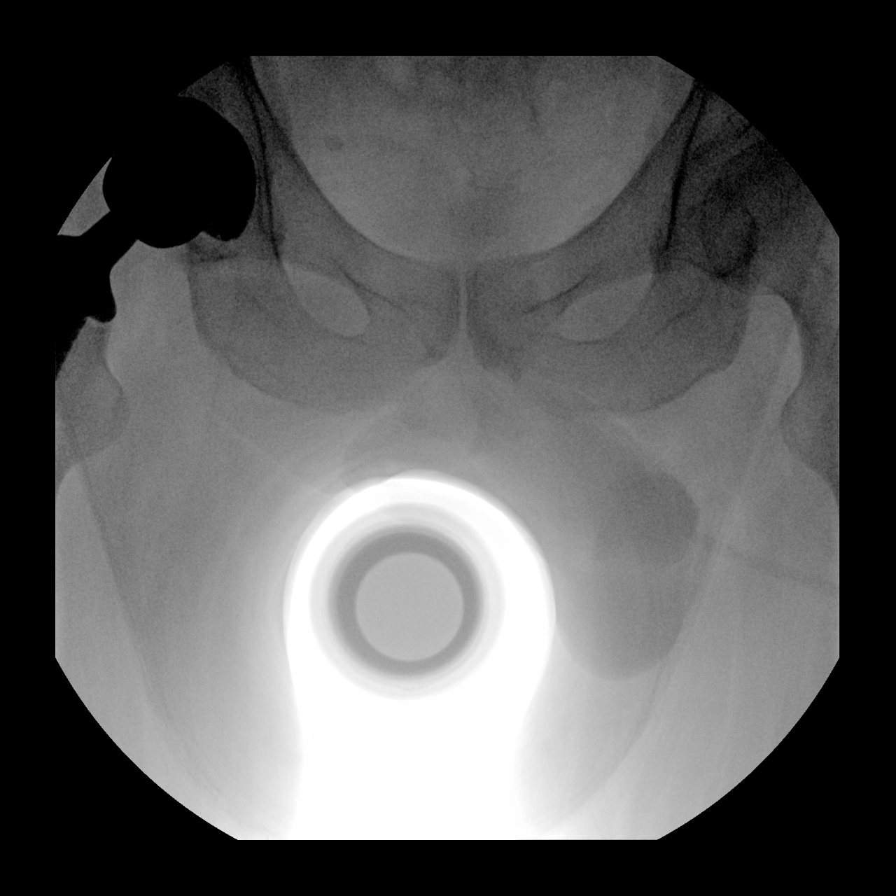

[5 of 5 positions shown; findings below may reference images not displayed]

FINDINGS: Total right hip replacement. Hardware intact. Anatomic alignment. No
acute bony abnormality identified. No evidence of fracture or
dislocation.
IMPRESSION: Total right hip replacement.  Hardware intact.  Anatomic alignment.

## 2022-03-28 ENCOUNTER — Ambulatory Visit: Payer: No Typology Code available for payment source | Admitting: Orthopaedic Surgery

## 2022-03-28 ENCOUNTER — Ambulatory Visit (INDEPENDENT_AMBULATORY_CARE_PROVIDER_SITE_OTHER): Payer: No Typology Code available for payment source

## 2022-03-28 DIAGNOSIS — G8929 Other chronic pain: Secondary | ICD-10-CM

## 2022-03-28 DIAGNOSIS — M25561 Pain in right knee: Secondary | ICD-10-CM

## 2022-03-28 DIAGNOSIS — M1711 Unilateral primary osteoarthritis, right knee: Secondary | ICD-10-CM

## 2022-03-28 NOTE — Progress Notes (Signed)
Art is a 72 year old gentleman well-known to me.  We actually replaced his right hip a few years ago.  He has been having right knee pain for several years now but is getting worse in terms of the detrimental effect it is having on his mobility, his actives daily living and his quality of life.  He has had a remote arthroscopic intervention of that right knee for a meniscal tear.  He has had steroid injections since then as well.  He has tried activity modification.  Pivoting activities cause a 7 out of pain.  He is having a hard time going downstairs as well.  He is otherwise a healthy individual.  He is here to discuss treatment options.  He denies any acute changes in his medical status.  Is not a diabetic.  He denies any fever or chills.  Examination of his right knee does shows varus malalignment that is correctable.  There is significant patellofemoral crepitation throughout the arc of motion of his knee.  There is medial joint line tenderness which is significant as well.  The knee is ligamentously stable.  2 views of the right knee show tricompartment arthritis involving mainly the medial compartment and patellofemoral joint.  There is bone-on-bone wear with osteophytes in all 3 compartments.  At this point we talked in length in detail about knee replacement surgery.  He has failed all forms of conservative treatment at this point and this would be the next surgical option for him.  I described this to him in detail.  We talked about the risks and benefits of surgery and what to expect from an intraoperative and postoperative course.  I did show him a knee replacement model and we went over his x-rays.  He is hoping to have this sometime in the spring.  We will be in touch about scheduling him for a right total knee arthroplasty.

## 2022-04-17 ENCOUNTER — Other Ambulatory Visit: Payer: Self-pay

## 2022-04-19 ENCOUNTER — Other Ambulatory Visit: Payer: Self-pay | Admitting: Physician Assistant

## 2022-04-19 ENCOUNTER — Encounter: Payer: Self-pay | Admitting: Radiology

## 2022-04-19 DIAGNOSIS — Z01818 Encounter for other preprocedural examination: Secondary | ICD-10-CM

## 2022-05-23 NOTE — Patient Instructions (Signed)
SURGICAL WAITING ROOM VISITATION  Patients having surgery or a procedure may have no more than 2 support people in the waiting area - these visitors may rotate.    Children under the age of 20 must have an adult with them who is not the patient.  Due to an increase in RSV and influenza rates and associated hospitalizations, children ages 77 and under may not visit patients in [facility] hospitals.  If the patient needs to stay at the hospital during part of their recovery, the visitor guidelines for inpatient rooms apply. Pre-op nurse will coordinate an appropriate time for 1 support person to accompany patient in pre-op.  This support person may not rotate.    Please refer to the [facility] website for the visitor guidelines for Inpatients (after your surgery is over and you are in a regular room).       Your procedure is scheduled on:  06/07/22    Report to [facility] Main Entrance    Report to admitting at  0515 AM   Call this number if you have problems the morning of surgery [phone]   Do not eat food :After Midnight.   After Midnight you may have the following liquids until _ 0415_____ AM  DAY OF SURGERY  Water Non-Citrus Juices (without pulp, NO RED-Apple, White grape, White cranberry) Black Coffee (NO MILK/CREAM OR CREAMERS, sugar ok)  Clear Tea (NO MILK/CREAM OR CREAMERS, sugar ok) regular and decaf                             Plain Jell-O (NO RED)                                           Fruit ices (not with fruit pulp, NO RED)                                     Popsicles (NO RED)                                                               Sports drinks like Gatorade (NO RED)                    The day of surgery:  Drink ONE (1) Pre-Surgery Clear Ensure or G2 at  0415 AM  ( have completed by ) the morning of surgery. Drink in one sitting. Do not sip.  This drink was given to you during your hospital  pre-op appointment visit. Nothing else to  drink after completing the  Pre-Surgery Clear Ensure or G2.          If you have questions, please contact your surgeon's office.       Oral Hygiene is also important to reduce your risk of infection.                                    Remember - BRUSH YOUR TEETH THE MORNING OF SURGERY  WITH YOUR REGULAR TOOTHPASTE  DENTURES WILL BE REMOVED PRIOR TO SURGERY PLEASE DO NOT APPLY "Poly grip" OR ADHESIVES!!!   Do NOT smoke after Midnight   Take these medicines the morning of surgery with A SIP OF WATER: omeprazole if needed   DO NOT TAKE ANY ORAL DIABETIC MEDICATIONS DAY OF YOUR SURGERY  Bring CPAP mask and tubing day of surgery.                              You may not have any metal on your body including hair pins, jewelry, and body piercing             Do not wear make-up, lotions, powders, perfumes/cologne, or deodorant  Do not wear nail polish including gel and S&S, artificial/acrylic nails, or any other type of covering on natural nails including finger and toenails. If you have artificial nails, gel coating, etc. that needs to be removed by a nail salon please have this removed prior to surgery or surgery may need to be canceled/ delayed if the surgeon/ anesthesia feels like they are unable to be safely monitored.   Do not shave  48 hours prior to surgery.               Men may shave face and neck.   Do not bring valuables to the hospital. [facility] IS NOT             RESPONSIBLE   FOR VALUABLES.   Contacts, glasses, dentures or bridgework may not be worn into surgery.   Bring small overnight bag day of surgery.   DO NOT BRING YOUR HOME MEDICATIONS TO THE HOSPITAL. PHARMACY WILL DISPENSE MEDICATIONS LISTED ON YOUR MEDICATION LIST TO YOU DURING YOUR ADMISSION IN THE HOSPITAL!    Patients discharged on the day of surgery will not be allowed to drive home.  Someone NEEDS to stay with you for the first 24 hours after anesthesia.   Special Instructions: Bring a copy of your  healthcare power of attorney and living will documents the day of surgery if you haven't scanned them before.              Please read over the following fact sheets you were given: IF YOU HAVE QUESTIONS ABOUT YOUR PRE-OP INSTRUCTIONS PLEASE CALL [phone]   If you received a COVID test during your pre-op visit  it is requested that you wear a mask when out in public, stay away from anyone that may not be feeling well and notify your surgeon if you develop symptoms. If you test positive for Covid or have been in contact with anyone that has tested positive in the last 10 days please notify you surgeon.

## 2022-05-23 NOTE — Progress Notes (Addendum)
Anesthesia Review:  PCP: [facility]  Cardiologist : none Chest x-ray : EKG : Echo : Stress test: Cardiac Cath :  Activity level: can do a flight of stairs without difficulty  Sleep Study/ CPAP : sleep apnea nonuse of cpap in 5 years  Fasting Blood Sugar :      / Checks Blood Sugar -- times a day:   Blood Thinner/ Instructions /Last Dose: ASA / Instructions/ Last Dose :

## 2022-05-28 ENCOUNTER — Other Ambulatory Visit: Payer: Self-pay

## 2022-05-28 ENCOUNTER — Encounter (HOSPITAL_COMMUNITY)
Admission: RE | Admit: 2022-05-28 | Discharge: 2022-05-28 | Disposition: A | Discharge status: Home / Self Care | Payer: No Typology Code available for payment source | Source: Ambulatory Visit | Attending: Orthopaedic Surgery | Admitting: Orthopaedic Surgery

## 2022-05-28 ENCOUNTER — Encounter (HOSPITAL_COMMUNITY): Payer: Self-pay

## 2022-05-28 VITALS — BP 115/85 | HR 63 | Temp 98.1°F | Resp 16 | Ht 74.0 in | Wt 248.0 lb

## 2022-05-28 DIAGNOSIS — Z01818 Encounter for other preprocedural examination: Secondary | ICD-10-CM

## 2022-05-28 DIAGNOSIS — Z01812 Encounter for preprocedural laboratory examination: Secondary | ICD-10-CM | POA: Insufficient documentation

## 2022-05-28 HISTORY — DX: Sleep apnea, unspecified: G47.30

## 2022-05-28 HISTORY — DX: Gastro-esophageal reflux disease without esophagitis: K21.9

## 2022-05-28 HISTORY — DX: Respiratory tuberculosis unspecified: A15.9

## 2022-05-28 LAB — COMPREHENSIVE METABOLIC PANEL
ALT: 22 U/L (ref 0–44)
AST: 33 U/L (ref 15–41)
Albumin: 4.5 g/dL (ref 3.5–5.0)
Alkaline Phosphatase: 75 U/L (ref 38–126)
Anion gap: 8 (ref 5–15)
BUN: 18 mg/dL (ref 8–23)
CO2: 25 mmol/L (ref 22–32)
Calcium: 9.3 mg/dL (ref 8.9–10.3)
Chloride: 105 mmol/L (ref 98–111)
Creatinine, Ser: 0.96 mg/dL (ref 0.61–1.24)
GFR, Estimated: >60 mL/min (ref >60)
Glucose, Bld: 87 mg/dL (ref 70–99)
Potassium: 4.2 mmol/L (ref 3.5–5.1)
Sodium: 138 mmol/L (ref 135–145)
Total Bilirubin: 1.3 mg/dL — ABNORMAL HIGH (ref 0.3–1.2)
Total Protein: 7.1 g/dL (ref 6.5–8.1)

## 2022-05-28 LAB — SURGICAL PCR SCREEN
MRSA, PCR: NEGATIVE
Staphylococcus aureus: NEGATIVE

## 2022-05-28 LAB — CBC
HCT: 46.4 % (ref 39.0–52.0)
Hemoglobin: 15.8 g/dL (ref 13.0–17.0)
MCH: 32 pg (ref 26.0–34.0)
MCHC: 34.1 g/dL (ref 30.0–36.0)
MCV: 94.1 fL (ref 80.0–100.0)
Platelets: 124 10*3/uL — ABNORMAL LOW (ref 150–400)
RBC: 4.93 MIL/uL (ref 4.22–5.81)
RDW: 12.9 % (ref 11.5–15.5)
WBC: 5 10*3/uL (ref 4.0–10.5)
nRBC: 0 % (ref 0.0–0.2)

## 2022-06-06 NOTE — Progress Notes (Signed)
PT surgery moved from 06/07/22 to 06/08/22.  Called pt and informed of new date and time.  Time of surgery- 1400-1557.  Arival to [employer]- 1130am.  Complete ERAS drink by 1100am.  Otherwise instructions stay the same and remember to do hibiclens shower on 06/07/22 and one on 06/08/22.  PT voiced understanding.

## 2022-06-07 LAB — TYPE AND SCREEN
ABO/RH(D): O POS
Antibody Screen: NEGATIVE

## 2022-06-07 NOTE — Progress Notes (Signed)
Spoke with Anesthesia PAC, Terance Hart, PA in regards to npo status on 06/08/22.  Asked about a light breakfast before 0800am.  Anesthesia stated no .  PT was called in informed but informed he could call his surgeon in regards to perhaps switching surgery time.  PT voiced understanding

## 2022-06-07 NOTE — H&P (Signed)
TOTAL KNEE ADMISSION H&P  Patient is being admitted for right total knee arthroplasty.  Subjective:  Chief Complaint:right knee pain.  HPI: Jason Barber, 72 y.o. male, has a history of pain and functional disability in the right knee due to arthritis and has failed non-surgical conservative treatments for greater than 12 weeks to includeNSAID's and/or analgesics, corticosteriod injections, flexibility and strengthening excercises, and activity modification.  Onset of symptoms was gradual, starting 7 years ago with gradually worsening course since that time. The patient noted prior procedures on the knee to include  arthroscopy on the right knee(s).  Patient currently rates pain in the right knee(s) at 7 out of 10 with activity. Patient has night pain, worsening of pain with activity and weight bearing, pain that interferes with activities of daily living, pain with passive range of motion, crepitus, and joint swelling.  Patient has evidence of subchondral sclerosis, periarticular osteophytes, and joint space narrowing by imaging studies. There is no active infection.  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   Current  Outpatient Medications  Medication Sig Dispense Refill Last Dose   gabapentin (NEURONTIN) 300 MG capsule Take 300 mg by mouth at bedtime.      naproxen (NAPROSYN) 500 MG tablet Take 500 mg by mouth 2 (two) times daily as needed (pain.).      omeprazole (PRILOSEC) 40 MG capsule Take 40 mg by mouth daily as needed (acid reflux).      traZODone (DESYREL) 150 MG tablet Take 150 mg by mouth at bedtime.      No Known Allergies  Social History   Tobacco Use   Smoking status: Never   Smokeless tobacco: Never  Substance Use Topics   Alcohol use: Yes    Comment: occational    No family history on file.   Review of Systems  Musculoskeletal:  Positive for gait problem and joint swelling.  All other systems reviewed and are negative.   Objective:  Physical Exam Vitals reviewed.  Constitutional:      Appearance: Normal appearance. He is normal weight.  HENT:     Head: Normocephalic and atraumatic.  Eyes:     Extraocular Movements: Extraocular movements intact.     Pupils: Pupils are equal, round, and reactive to light.  Cardiovascular:     Rate and Rhythm: Normal rate.     Pulses: Normal pulses.  Pulmonary:     Effort: Pulmonary effort is normal.     Breath sounds: Normal breath sounds.  Abdominal:     Palpations: Abdomen is soft.  Musculoskeletal:     Cervical back: Normal range of motion and neck supple.     Right knee: Effusion,  bony tenderness and crepitus present. Decreased range of motion. Tenderness present over the medial joint line and lateral joint line. Abnormal alignment and abnormal meniscus.  Neurological:     Mental Status: He is alert and oriented to person, place, and time.  Psychiatric:        Behavior: Behavior normal.     Vital signs in last 24 hours:    Labs:   Estimated body mass index is 31.84 kg/m as calculated from the following:   Height as of 05/28/22:  (1.88 m).   Weight as of 05/28/22: 112.5 kg.   Imaging Review Plain radiographs  demonstrate severe degenerative joint disease of the right knee(s). The overall alignment ismild varus. The bone quality appears to be excellent for age and reported activity level.      Assessment/Plan:  End stage arthritis, right knee   The patient history, physical examination, clinical judgment of the provider and imaging studies are consistent with end stage degenerative joint disease of the right knee(s) and total knee arthroplasty is deemed medically necessary. The treatment options including medical management, injection therapy arthroscopy and arthroplasty were discussed at length. The risks and benefits of total knee arthroplasty were presented and reviewed. The risks due to aseptic loosening, infection, stiffness, patella tracking problems, thromboembolic complications and other imponderables were discussed. The patient acknowledged the explanation, agreed to proceed with the plan and consent was signed. Patient is being admitted for inpatient treatment for surgery, pain control, PT, OT, prophylactic antibiotics, VTE prophylaxis, progressive ambulation and ADL's and discharge planning. The patient is planning to be discharged home with home health services

## 2022-06-07 NOTE — Progress Notes (Signed)
PT called on 06/07/2022 at 1315pm and LVMM stating at what time DOS could he having something to eat that am.  Called pt back and instructed pt that he could have the liquids listed on preop instructionsi from 12 midnite until 1100am.,  No food after midnite on 06/07/22.  And remember to dirink Ensure presurgery drink by 1100am morning of surgery.  PT  voiced understanding.

## 2022-06-08 ENCOUNTER — Other Ambulatory Visit: Payer: Self-pay

## 2022-06-08 ENCOUNTER — Encounter (HOSPITAL_COMMUNITY)
Admission: RE | Disposition: A | Discharge status: Home Health | Payer: Self-pay | Source: Ambulatory Visit | Attending: Orthopaedic Surgery

## 2022-06-08 ENCOUNTER — Encounter (HOSPITAL_COMMUNITY): Payer: Self-pay | Admitting: Orthopaedic Surgery

## 2022-06-08 ENCOUNTER — Observation Stay (HOSPITAL_COMMUNITY): Payer: No Typology Code available for payment source

## 2022-06-08 ENCOUNTER — Observation Stay (HOSPITAL_COMMUNITY)
Admission: RE | Admit: 2022-06-08 | Discharge: 2022-06-09 | Disposition: A | Discharge status: Home Health | Payer: No Typology Code available for payment source | Source: Ambulatory Visit | Attending: Orthopaedic Surgery | Admitting: Orthopaedic Surgery

## 2022-06-08 ENCOUNTER — Ambulatory Visit (HOSPITAL_COMMUNITY): Payer: No Typology Code available for payment source | Admitting: Physician Assistant

## 2022-06-08 ENCOUNTER — Ambulatory Visit (HOSPITAL_COMMUNITY): Payer: No Typology Code available for payment source | Admitting: Anesthesiology

## 2022-06-08 DIAGNOSIS — M1711 Unilateral primary osteoarthritis, right knee: Secondary | ICD-10-CM | POA: Diagnosis present

## 2022-06-08 DIAGNOSIS — Z96651 Presence of right artificial knee joint: Secondary | ICD-10-CM

## 2022-06-08 DIAGNOSIS — G4730 Sleep apnea, unspecified: Secondary | ICD-10-CM

## 2022-06-08 DIAGNOSIS — Z85828 Personal history of other malignant neoplasm of skin: Secondary | ICD-10-CM | POA: Insufficient documentation

## 2022-06-08 DIAGNOSIS — Z96641 Presence of right artificial hip joint: Secondary | ICD-10-CM | POA: Diagnosis not present

## 2022-06-08 HISTORY — PX: TOTAL KNEE ARTHROPLASTY: SHX125

## 2022-06-08 SURGERY — ARTHROPLASTY, KNEE, TOTAL
Anesthesia: Monitor Anesthesia Care | Site: Knee | Laterality: Right

## 2022-06-08 MED ORDER — PHENOL 1.4 % MT LIQD: 1.0000 | OROMUCOSAL | Status: DC | PRN

## 2022-06-08 MED ORDER — SODIUM CHLORIDE 0.9 % IR SOLN
Status: DC | PRN
  Administered 2022-06-08: 1000 mL

## 2022-06-08 MED ORDER — EPINEPHRINE PF 1 MG/ML IJ SOLN
INTRAMUSCULAR | Status: AC
  Filled 2022-06-08: qty 1

## 2022-06-08 MED ORDER — ACETAMINOPHEN 500 MG PO TABS
1000.0000 mg | ORAL_TABLET | Freq: Once | ORAL | Status: AC
  Administered 2022-06-08: 1000 mg via ORAL
  Filled 2022-06-08: qty 2

## 2022-06-08 MED ORDER — ACETAMINOPHEN 160 MG/5ML PO SOLN: 325.0000 mg | ORAL | Status: DC | PRN

## 2022-06-08 MED ORDER — DOCUSATE SODIUM 100 MG PO CAPS
100.0000 mg | ORAL_CAPSULE | Freq: Two times a day (BID) | ORAL | Status: DC
  Filled 2022-06-08: qty 1

## 2022-06-08 MED ORDER — BUPIVACAINE HCL (PF) 0.25 % IJ SOLN
INTRAMUSCULAR | Status: DC | PRN
  Administered 2022-06-08: 30 mL

## 2022-06-08 MED ORDER — ONDANSETRON HCL 4 MG/2ML IJ SOLN: 4.0000 mg | Freq: Once | INTRAMUSCULAR | Status: DC | PRN

## 2022-06-08 MED ORDER — 0.9 % SODIUM CHLORIDE (POUR BTL) OPTIME
TOPICAL | Status: DC | PRN
  Administered 2022-06-08: 1000 mL

## 2022-06-08 MED ORDER — ONDANSETRON HCL 4 MG/2ML IJ SOLN: 4.0000 mg | Freq: Four times a day (QID) | INTRAMUSCULAR | Status: DC | PRN

## 2022-06-08 MED ORDER — BUPIVACAINE IN DEXTROSE 0.75-8.25 % IT SOLN
INTRATHECAL | Status: DC | PRN
  Administered 2022-06-08: 2 mL via INTRATHECAL

## 2022-06-08 MED ORDER — OXYCODONE HCL 5 MG PO TABS: 10.0000 mg | ORAL_TABLET | ORAL | Status: DC | PRN

## 2022-06-08 MED ORDER — METHOCARBAMOL 500 MG PO TABS
500.0000 mg | ORAL_TABLET | Freq: Four times a day (QID) | ORAL | Status: DC | PRN
  Administered 2022-06-08 – 2022-06-09 (×3): 500 mg via ORAL
  Filled 2022-06-08 (×3): qty 1

## 2022-06-08 MED ORDER — ASPIRIN 81 MG PO CHEW
81.0000 mg | CHEWABLE_TABLET | Freq: Two times a day (BID) | ORAL | Status: DC
  Administered 2022-06-08 – 2022-06-09 (×2): 81 mg via ORAL
  Filled 2022-06-08 (×2): qty 1

## 2022-06-08 MED ORDER — PROPOFOL 1000 MG/100ML IV EMUL
INTRAVENOUS | Status: AC
  Filled 2022-06-08: qty 100

## 2022-06-08 MED ORDER — DIPHENHYDRAMINE HCL 12.5 MG/5ML PO ELIX: 12.5000 mg | ORAL_SOLUTION | ORAL | Status: DC | PRN

## 2022-06-08 MED ORDER — CELECOXIB 200 MG PO CAPS
200.0000 mg | ORAL_CAPSULE | Freq: Once | ORAL | Status: AC
  Administered 2022-06-08: 200 mg via ORAL
  Filled 2022-06-08: qty 1

## 2022-06-08 MED ORDER — HYDROMORPHONE HCL 1 MG/ML IJ SOLN: 0.5000 mg | INTRAMUSCULAR | Status: DC | PRN

## 2022-06-08 MED ORDER — ACETAMINOPHEN 325 MG PO TABS: 325.0000 mg | ORAL_TABLET | ORAL | Status: DC | PRN

## 2022-06-08 MED ORDER — BUPIVACAINE HCL (PF) 0.25 % IJ SOLN
INTRAMUSCULAR | Status: AC
  Filled 2022-06-08: qty 30

## 2022-06-08 MED ORDER — MIDAZOLAM HCL 2 MG/2ML IJ SOLN
INTRAMUSCULAR | Status: AC
  Administered 2022-06-08: 2 mg via INTRAVENOUS
  Filled 2022-06-08: qty 2

## 2022-06-08 MED ORDER — MENTHOL 3 MG MT LOZG: 1.0000 | LOZENGE | OROMUCOSAL | Status: DC | PRN

## 2022-06-08 MED ORDER — ACETAMINOPHEN 325 MG PO TABS: 325.0000 mg | ORAL_TABLET | Freq: Four times a day (QID) | ORAL | Status: DC | PRN

## 2022-06-08 MED ORDER — EPINEPHRINE PF 1 MG/ML IJ SOLN
INTRAMUSCULAR | Status: DC | PRN
  Administered 2022-06-08: .15 mL

## 2022-06-08 MED ORDER — GABAPENTIN 300 MG PO CAPS
300.0000 mg | ORAL_CAPSULE | Freq: Every day | ORAL | Status: DC
  Administered 2022-06-08: 300 mg via ORAL
  Filled 2022-06-08: qty 1

## 2022-06-08 MED ORDER — ALUM & MAG HYDROXIDE-SIMETH 200-200-20 MG/5ML PO SUSP: 30.0000 mL | ORAL | Status: DC | PRN

## 2022-06-08 MED ORDER — ONDANSETRON HCL 4 MG PO TABS: 4.0000 mg | ORAL_TABLET | Freq: Four times a day (QID) | ORAL | Status: DC | PRN

## 2022-06-08 MED ORDER — CEFAZOLIN SODIUM-DEXTROSE 1-4 GM/50ML-% IV SOLN
1.0000 g | Freq: Four times a day (QID) | INTRAVENOUS | Status: AC
  Administered 2022-06-08 – 2022-06-09 (×2): 1 g via INTRAVENOUS
  Filled 2022-06-08 (×2): qty 50

## 2022-06-08 MED ORDER — FENTANYL CITRATE PF 50 MCG/ML IJ SOSY: 25.0000 ug | PREFILLED_SYRINGE | INTRAMUSCULAR | Status: DC | PRN

## 2022-06-08 MED ORDER — OXYCODONE HCL 5 MG PO TABS: 5.0000 mg | ORAL_TABLET | Freq: Once | ORAL | Status: DC | PRN

## 2022-06-08 MED ORDER — ONDANSETRON HCL 4 MG/2ML IJ SOLN
INTRAMUSCULAR | Status: DC | PRN
  Administered 2022-06-08: 4 mg via INTRAVENOUS

## 2022-06-08 MED ORDER — OXYCODONE HCL 5 MG PO TABS
5.0000 mg | ORAL_TABLET | ORAL | Status: DC | PRN
  Administered 2022-06-08 – 2022-06-09 (×4): 10 mg via ORAL
  Filled 2022-06-08 (×4): qty 2

## 2022-06-08 MED ORDER — ONDANSETRON HCL 4 MG/2ML IJ SOLN
INTRAMUSCULAR | Status: AC
  Filled 2022-06-08: qty 2

## 2022-06-08 MED ORDER — POVIDONE-IODINE 10 % EX SWAB: 2.0000 | Freq: Once | CUTANEOUS | Status: DC

## 2022-06-08 MED ORDER — LACTATED RINGERS IV SOLN: INTRAVENOUS | Status: DC

## 2022-06-08 MED ORDER — ROPIVACAINE HCL 5 MG/ML IJ SOLN
INTRAMUSCULAR | Status: DC | PRN
  Administered 2022-06-08: 20 mL via PERINEURAL

## 2022-06-08 MED ORDER — CHLORHEXIDINE GLUCONATE 0.12 % MT SOLN
15.0000 mL | Freq: Once | OROMUCOSAL | Status: AC
  Administered 2022-06-08: 15 mL via OROMUCOSAL

## 2022-06-08 MED ORDER — MIDAZOLAM HCL 2 MG/2ML IJ SOLN: 1.0000 mg | Freq: Once | INTRAMUSCULAR | Status: AC

## 2022-06-08 MED ORDER — FENTANYL CITRATE PF 50 MCG/ML IJ SOSY
PREFILLED_SYRINGE | INTRAMUSCULAR | Status: AC
  Administered 2022-06-08: 100 ug via INTRAVENOUS
  Filled 2022-06-08: qty 2

## 2022-06-08 MED ORDER — METOCLOPRAMIDE HCL 5 MG/ML IJ SOLN: 5.0000 mg | Freq: Three times a day (TID) | INTRAMUSCULAR | Status: DC | PRN

## 2022-06-08 MED ORDER — PROPOFOL 10 MG/ML IV BOLUS
INTRAVENOUS | Status: DC | PRN
  Administered 2022-06-08: 30 mg via INTRAVENOUS
  Administered 2022-06-08 (×4): 20 mg via INTRAVENOUS

## 2022-06-08 MED ORDER — POLYETHYLENE GLYCOL 3350 17 G PO PACK: 17.0000 g | PACK | Freq: Every day | ORAL | Status: DC | PRN

## 2022-06-08 MED ORDER — CEFAZOLIN SODIUM-DEXTROSE 2-4 GM/100ML-% IV SOLN
2.0000 g | INTRAVENOUS | Status: AC
  Administered 2022-06-08: 2 g via INTRAVENOUS
  Filled 2022-06-08: qty 100

## 2022-06-08 MED ORDER — TRANEXAMIC ACID-NACL 1000-0.7 MG/100ML-% IV SOLN
1000.0000 mg | INTRAVENOUS | Status: AC
  Administered 2022-06-08: 1000 mg via INTRAVENOUS
  Filled 2022-06-08: qty 100

## 2022-06-08 MED ORDER — OXYCODONE HCL 5 MG/5ML PO SOLN: 5.0000 mg | Freq: Once | ORAL | Status: DC | PRN

## 2022-06-08 MED ORDER — SODIUM CHLORIDE 0.9 % IV SOLN: INTRAVENOUS | Status: DC

## 2022-06-08 MED ORDER — DEXAMETHASONE SODIUM PHOSPHATE 10 MG/ML IJ SOLN
INTRAMUSCULAR | Status: DC | PRN
  Administered 2022-06-08: 10 mg via INTRAVENOUS

## 2022-06-08 MED ORDER — LIDOCAINE 2% (20 MG/ML) 5 ML SYRINGE
INTRAMUSCULAR | Status: DC | PRN
  Administered 2022-06-08: 100 mg via INTRAVENOUS

## 2022-06-08 MED ORDER — DEXAMETHASONE SODIUM PHOSPHATE 10 MG/ML IJ SOLN
INTRAMUSCULAR | Status: AC
  Filled 2022-06-08: qty 1

## 2022-06-08 MED ORDER — FENTANYL CITRATE PF 50 MCG/ML IJ SOSY: 50.0000 ug | PREFILLED_SYRINGE | Freq: Once | INTRAMUSCULAR | Status: AC

## 2022-06-08 MED ORDER — MEPERIDINE HCL 50 MG/ML IJ SOLN: 6.2500 mg | INTRAMUSCULAR | Status: DC | PRN

## 2022-06-08 MED ORDER — PROPOFOL 500 MG/50ML IV EMUL
INTRAVENOUS | Status: DC | PRN
  Administered 2022-06-08: 50 ug/kg/min via INTRAVENOUS

## 2022-06-08 MED ORDER — PANTOPRAZOLE SODIUM 40 MG PO TBEC
40.0000 mg | DELAYED_RELEASE_TABLET | Freq: Every day | ORAL | Status: DC
  Administered 2022-06-08 – 2022-06-09 (×2): 40 mg via ORAL
  Filled 2022-06-08 (×2): qty 1

## 2022-06-08 MED ORDER — METOCLOPRAMIDE HCL 5 MG PO TABS: 5.0000 mg | ORAL_TABLET | Freq: Three times a day (TID) | ORAL | Status: DC | PRN

## 2022-06-08 MED ORDER — METHOCARBAMOL 500 MG IVPB - SIMPLE MED: 500.0000 mg | Freq: Four times a day (QID) | INTRAVENOUS | Status: DC | PRN

## 2022-06-08 SURGICAL SUPPLY — 68 items
APL SKNCLS STERI-STRIP NONHPOA (GAUZE/BANDAGES/DRESSINGS)
BAG COUNTER SPONGE SURGICOUNT (BAG) IMPLANT
BAG SPEC THK2 15X12 ZIP CLS (MISCELLANEOUS) ×1
BAG SPNG CNTER NS LX DISP (BAG) ×1
BAG ZIPLOCK 12X15 (MISCELLANEOUS) ×1 IMPLANT
BENZOIN TINCTURE PRP APPL 2/3 (GAUZE/BANDAGES/DRESSINGS) IMPLANT
BLADE SAG 18X100X1.27 (BLADE) ×1 IMPLANT
BLADE SURG SZ10 CARB STEEL (BLADE) ×2 IMPLANT
BNDG CMPR 5X62 HK CLSR LF (GAUZE/BANDAGES/DRESSINGS) ×2
BNDG CMPR MED 10X6 ELC LF (GAUZE/BANDAGES/DRESSINGS) ×1
BNDG ELASTIC 6INX 5YD STR LF (GAUZE/BANDAGES/DRESSINGS) ×2 IMPLANT
BNDG ELASTIC 6X10 VLCR STRL LF (GAUZE/BANDAGES/DRESSINGS) IMPLANT
BOWL SMART MIX CTS (DISPOSABLE) IMPLANT
CEMENT BONE SIMPLEX SPEEDSET (Cement) IMPLANT
COMP FEM STD PS KNEE CR 12 RT (Joint) ×1 IMPLANT
COMP PATELLAR 10X35 METAL (Joint) ×1 IMPLANT
COMP TIB KNEE PS G 0 RT (Joint) ×1 IMPLANT
COMPONENT FEM STD PS KN CR12RT (Joint) IMPLANT
COMPONENT PATELLAR 10X35 METAL (Joint) IMPLANT
COMPONENT TIB KNEE PS G 0 RT (Joint) IMPLANT
COOLER ICEMAN CLASSIC (MISCELLANEOUS) ×1 IMPLANT
COVER SURGICAL LIGHT HANDLE (MISCELLANEOUS) ×1 IMPLANT
CUFF TOURN SGL QUICK 34 (TOURNIQUET CUFF) ×1
CUFF TRNQT CYL 34X4.125X (TOURNIQUET CUFF) ×1 IMPLANT
DRAPE INCISE IOBAN 66X45 STRL (DRAPES) ×1 IMPLANT
DRAPE U-SHAPE 47X51 STRL (DRAPES) ×1 IMPLANT
DURAPREP 26ML APPLICATOR (WOUND CARE) ×1 IMPLANT
ELECT BLADE TIP CTD 4 INCH (ELECTRODE) ×1 IMPLANT
ELECT REM PT RETURN 15FT ADLT (MISCELLANEOUS) ×1 IMPLANT
FILTER STRAW (MISCELLANEOUS) IMPLANT
GAUZE PAD ABD 8X10 STRL (GAUZE/BANDAGES/DRESSINGS) ×2 IMPLANT
GAUZE SPONGE 4X4 12PLY STRL (GAUZE/BANDAGES/DRESSINGS) ×1 IMPLANT
GAUZE XEROFORM 1X8 LF (GAUZE/BANDAGES/DRESSINGS) IMPLANT
GLOVE BIO SURGEON STRL SZ7.5 (GLOVE) ×1 IMPLANT
GLOVE BIOGEL PI IND STRL 8 (GLOVE) ×2 IMPLANT
GLOVE ECLIPSE 8.0 STRL XLNG CF (GLOVE) ×1 IMPLANT
GOWN STRL REUS W/ TWL XL LVL3 (GOWN DISPOSABLE) ×2 IMPLANT
GOWN STRL REUS W/TWL XL LVL3 (GOWN DISPOSABLE) ×2
HANDPIECE INTERPULSE COAX TIP (DISPOSABLE) ×1
HDLS TROCR DRIL PIN KNEE 75 (PIN) ×1
HOLDER FOLEY CATH W/STRAP (MISCELLANEOUS) IMPLANT
IMMOBILIZER KNEE 20 (SOFTGOODS) ×1
IMMOBILIZER KNEE 20 THIGH 36 (SOFTGOODS) ×1 IMPLANT
IMMOBILIZER KNEE 22 UNIV (SOFTGOODS) IMPLANT
KIT TURNOVER KIT A (KITS) IMPLANT
KNEE SYSTEM MC 10 RT (Knees) IMPLANT
NDL SIDE PORT 18GA QUICK PRESS (NEEDLE) IMPLANT
NS IRRIG 1000ML POUR BTL (IV SOLUTION) ×1 IMPLANT
PACK TOTAL KNEE CUSTOM (KITS) ×1 IMPLANT
PAD COLD SHLDR WRAP-ON (PAD) ×1 IMPLANT
PADDING CAST COTTON 6X4 STRL (CAST SUPPLIES) ×2 IMPLANT
PIN DRILL HDLS TROCAR 75 4PK (PIN) IMPLANT
PROTECTOR NERVE ULNAR (MISCELLANEOUS) ×1 IMPLANT
SCREW FEMALE HEX FIX 25X2.5 (ORTHOPEDIC DISPOSABLE SUPPLIES) IMPLANT
SET HNDPC FAN SPRY TIP SCT (DISPOSABLE) ×1 IMPLANT
SET PAD KNEE POSITIONER (MISCELLANEOUS) ×1 IMPLANT
SPIKE FLUID TRANSFER (MISCELLANEOUS) IMPLANT
STAPLER VISISTAT 35W (STAPLE) IMPLANT
STRIP CLOSURE SKIN 1/2X4 (GAUZE/BANDAGES/DRESSINGS) IMPLANT
SUT MNCRL AB 4-0 PS2 18 (SUTURE) IMPLANT
SUT VIC AB 0 CT1 27 (SUTURE) ×1
SUT VIC AB 0 CT1 27XBRD ANTBC (SUTURE) ×1 IMPLANT
SUT VIC AB 1 CT1 36 (SUTURE) ×2 IMPLANT
SUT VIC AB 2-0 CT1 27 (SUTURE) ×2
SUT VIC AB 2-0 CT1 TAPERPNT 27 (SUTURE) ×2 IMPLANT
SYR 27GX1/2 1ML LL SAFETY (SYRINGE) IMPLANT
TRAY FOLEY MTR SLVR 16FR STAT (SET/KITS/TRAYS/PACK) IMPLANT
WATER STERILE IRR 1000ML POUR (IV SOLUTION) ×2 IMPLANT

## 2022-06-08 NOTE — Anesthesia Procedure Notes (Signed)
Spinal  Patient location during procedure: OR Start time: 06/08/2022 1:42 PM End time: 06/08/2022 1:46 PM Reason for block: surgical anesthesia Staffing Performed: resident/CRNA  Anesthesiologist: Bethena Midget, MD Resident/CRNA: Florene Route, CRNA Performed by: Florene Route, CRNA Authorized by: Bethena Midget, MD   Preanesthetic Checklist Completed: patient identified, IV checked, site marked, risks and benefits discussed, surgical consent, monitors and equipment checked, pre-op evaluation and timeout performed Spinal Block Patient position: sitting Prep: DuraPrep Patient monitoring: heart rate, cardiac monitor, continuous pulse ox and blood pressure Approach: midline Location: L4-5 Injection technique: single-shot Needle Needle type: Pencan  Needle gauge: 24 G Needle length: 10 cm Assessment Sensory level: T4 Events: CSF return Additional Notes Kit expiration date 05/12/2024, Lot # [id number] Clear free flow CSF, negative heme, negative paresthesia Tolerated well and returned to supine position

## 2022-06-08 NOTE — Anesthesia Preprocedure Evaluation (Addendum)
Anesthesia Evaluation  Patient identified by MRN, date of birth, ID band Patient awake    Reviewed: Allergy & Precautions, NPO status , Patient's Chart, lab work & pertinent test results  Airway Mallampati: III  TM Distance: >3 FB Neck ROM: Full  Mouth opening: Limited Mouth Opening  Dental no notable dental hx. (+) Teeth Intact, Dental Advisory Given   Pulmonary sleep apnea (pt denies, no CPAP)    Pulmonary exam normal breath sounds clear to auscultation       Cardiovascular negative cardio ROS Normal cardiovascular exam Rhythm:Regular Rate:Normal     Neuro/Psych negative neurological ROS  negative psych ROS   GI/Hepatic negative GI ROS, Neg liver ROS,GERD  Medicated,,  Endo/Other  negative endocrine ROS    Renal/GU negative Renal ROS  negative genitourinary   Musculoskeletal  (+) Arthritis ,    Abdominal   Peds  Hematology  (+) Blood dyscrasia (plt 116)   Anesthesia Other Findings   Reproductive/Obstetrics                             Anesthesia Physical Anesthesia Plan  ASA: 2  Anesthesia Plan: Spinal, Regional and MAC   Post-op Pain Management: Minimal or no pain anticipated   Induction: Intravenous  PONV Risk Score and Plan: 1 and Treatment may vary due to age or medical condition, Propofol infusion and Midazolam  Airway Management Planned: Natural Airway, Simple Face Mask, Nasal Cannula and Mask  Additional Equipment: None  Intra-op Plan:   Post-operative Plan:   Informed Consent: I have reviewed the patients History and Physical, chart, labs and discussed the procedure including the risks, benefits and alternatives for the proposed anesthesia with the patient or authorized representative who has indicated his/her understanding and acceptance.     Dental advisory given  Plan Discussed with: CRNA and Anesthesiologist  Anesthesia Plan Comments:          Anesthesia Quick Evaluation

## 2022-06-08 NOTE — Anesthesia Procedure Notes (Signed)
Anesthesia Regional Block: Adductor canal block   Pre-Anesthetic Checklist: , timeout performed,  Correct Patient, Correct Site, Correct Laterality,  Correct Procedure, Correct Position, site marked,  Risks and benefits discussed,  Surgical consent,  Pre-op evaluation,  At surgeon's request and post-op pain management  Laterality: Right  Prep: chloraprep       Needles:  Injection technique: Single-shot  Needle Type: Echogenic Stimulator Needle     Needle Length: 5cm  Needle Gauge: 22     Additional Needles:   Procedures:,,,, ultrasound used (permanent image in chart),,    Narrative:  Start time: 06/08/2022 12:58 PM End time: 06/08/2022 1:02 PM Injection made incrementally with aspirations every 5 mL.  Performed by: Personally  Anesthesiologist: Bethena Midget, MD  Additional Notes: Functioning IV was confirmed and monitors were applied.  A 50mm 22ga Arrow echogenic stimulator needle was used. Sterile prep and drape,hand hygiene and sterile gloves were used. Ultrasound guidance: relevant anatomy identified, needle position confirmed, local anesthetic spread visualized around nerve(s)., vascular puncture avoided.  Image printed for medical record. Negative aspiration and negative test dose prior to incremental administration of local anesthetic. The patient tolerated the procedure well.

## 2022-06-08 NOTE — Anesthesia Postprocedure Evaluation (Signed)
Anesthesia Post Note  Patient: Jason Barber  Procedure(s) Performed: RIGHT TOTAL KNEE ARTHROPLASTY (Right: Knee)     Patient location during evaluation: PACU Anesthesia Type: Regional, Spinal and MAC Level of consciousness: oriented and awake and alert Pain management: pain level controlled Vital Signs Assessment: post-procedure vital signs reviewed and stable Respiratory status: spontaneous breathing, respiratory function stable and patient connected to nasal cannula oxygen Cardiovascular status: blood pressure returned to baseline and stable Postop Assessment: no headache, no backache and no apparent nausea or vomiting Anesthetic complications: no   No notable events documented.  Last Vitals:  Vitals:   06/08/22 1745 06/08/22 1800  BP: 139/79 (!) 141/92  Pulse: (!) 59 65  Resp: 14 14  Temp:  36.7 C  SpO2: 99% 98%    Last Pain:  Vitals:   06/08/22 1800  TempSrc:   PainSc: 0-No pain                 Jazlyne Gauger

## 2022-06-08 NOTE — Anesthesia Procedure Notes (Signed)
Date/Time: 06/08/2022 1:40 PM  Performed by: Florene Route, CRNAOxygen Delivery Method: Simple face mask

## 2022-06-08 NOTE — Interval H&P Note (Signed)
History and Physical Interval Note: The patient understands that he is here today for a right knee replacement to treat his severe right knee arthritis.  There has been no acute or interval change in his medical status.  See H&P.  The risks and benefits of surgery been explained in detail and informed consent is obtained.  The right operative knee has been marked.  06/08/2022 12:25 PM  Jason Barber  has presented today for surgery, with the diagnosis of osteoarthritis right knee.  The various methods of treatment have been discussed with the patient and family. After consideration of risks, benefits and other options for treatment, the patient has consented to  Procedure(s): RIGHT TOTAL KNEE ARTHROPLASTY (Right) as a surgical intervention.  The patient's history has been reviewed, patient examined, no change in status, stable for surgery.  I have reviewed the patient's chart and labs.  Questions were answered to the patient's satisfaction.     Kathryne Hitch

## 2022-06-08 NOTE — Transfer of Care (Signed)
Immediate Anesthesia Transfer of Care Note  Patient: Jason Barber  Procedure(s) Performed: RIGHT TOTAL KNEE ARTHROPLASTY (Right: Knee)  Patient Location: PACU  Anesthesia Type:Spinal  Level of Consciousness: awake and alert   Airway & Oxygen Therapy: Patient Spontanous Breathing and Patient connected to face mask oxygen  Post-op Assessment: Report given to RN and Post -op Vital signs reviewed and stable  Post vital signs: Reviewed and stable  Last Vitals:  Vitals Value Taken Time  BP 140/90 06/08/22 1549  Temp    Pulse 63 06/08/22 1550  Resp    SpO2 94 % 06/08/22 1550  Vitals shown include unvalidated device data.  Last Pain:  Vitals:   06/08/22 1158  TempSrc:   PainSc: 0-No pain         Complications: No notable events documented.

## 2022-06-08 NOTE — Op Note (Signed)
Operative Note  Date of operation: 06/08/2022 Preoperative diagnosis: Right knee primary osteoarthritis Postoperative diagnosis: Same  Procedure: Right press-fit total knee arthroplasty  Implants: Biomet/Zimmer persona press-fit knee system Implant Name Type Inv. Item Serial No. Manufacturer Lot No. LRB No. Used Action  COMP PATELLAR 10X35 METAL - [id number] Joint COMP PATELLAR 10X35 METAL  ZIMMER RECON(ORTH,TRAU,BIO,SG) [id number] Right 1 Implanted  COMP TIB KNEE PS G 0 RT - [id number] Joint COMP TIB KNEE PS G 0 RT  ZIMMER RECON(ORTH,TRAU,BIO,SG) [id number] Right 1 Implanted  KNEE SYSTEM MC 10 RT - [id number] Knees KNEE SYSTEM MC 10 RT  ZIMMER RECON(ORTH,TRAU,BIO,SG) [id number] Right 1 Implanted  COMP FEM STD PS KNEE CR 12 RT - [id number] Joint COMP FEM STD PS KNEE CR 12 RT  ZIMMER RECON(ORTH,TRAU,BIO,SG) [id number] Right 1 Implanted    Surgeon: Vanita Panda. Magnus Ivan, MD Assistant: Rexene Edison, PA-C  Anesthesia: #1 right lower extremity adductor canal block, #2 spinal, #3 local Tourniquet time: Less than 1 hour EBL: Less than 100 cc Antibiotics: IV Ancef Complications: None  Indications: The patient is a 72 year old gentleman well-known to [place].  He has debilitating arthritis involving his right knee.  He has tried and failed all forms of conservative treatment and is at the point where his right knee pain is daily and it is detrimentally affecting his mobility, his quality of life and his actives of daily living.  He wishes to proceed with a total knee arthroplasty on the right side and we agree with this as well based on his clinical exam and x-ray findings and the failure conservative treatment.  We did describe the risk of acute blood loss anemia, nerve or vessel injury, fracture, infection, DVT, implant failure and wound healing issues.  He understands her goals are decreased pain, improve mobility, and improve quality of life.  Procedure description: After informed consent was obtained and  the appropriate right knee was marked, the patient was brought to the operating room.  Of note in the holding room he did have a right lower extremity adductor canal block.  Once in the operating room he was set up on the operating table where spinal anesthesia was obtained.  He was then laid in supine position a Foley catheter was placed.  A nonsterile tourniquet is placed around his upper right thigh and his right thigh, knee, leg and ankle were prepped and draped with DuraPrep and sterile drapes.  This included a sterile stockinette.  A timeout was called and he was then identified as the correct patient and the correct right knee.  An Esmarch was used to wrap out the leg and the tourniquet was inflated to 300 mm of pressure.  With the right knee extended I made a direct midline incision over the patella and carried this proximally distally.  Dissection was carried down to the knee joint and the medial parapatellar arthrotomy was made.  With the knee in a flexed position we found significant cartilage wear throughout the knee.  There were osteophytes in all 3 compartments that we removed.  Remnants of the ACL as well as medial lateral meniscus were removed.  We then used an extramedullary cutting guide for making her proximal tibia cut correction for varus and valgus and a 7 degree slope.  We made this cut without difficulty and then brought it down to more millimeters.  We then used an intramedullary guide for making her distal femur cut setting this for right knee at 5 degrees externally rotated for 10 mm distal femoral cut.  We made that cut without difficulty and brought the knee back down to full extension and achieve full extension with a 10 mm extension block.  We then back to the femur for femoral sizing guide based off of the epicondylar axis and from there we chose a size 12 femur.  We put a 4-in-1 cutting block for size 12 femur and made our anterior and posterior cuts followed by her chamfer cuts.  We  then went back to the tibia and chose a size G right tibial tray for coverage over the tibial plateau setting the rotation of the tibial tubercle and the femur.  We did our drill hole and keel punch off of this and felt that he had really good quality bone for press-fit implants.  We then trialed our size G right tibia followed by our size 12 right CR standard femur.  We placed a 12 mm right medial congruent polythene insert.  We are pleased with range of motion and stability without insert.  We then made a patella cut and drilled a single hole for a size 35 press-fit patella button.  We then removed all instrumentation from the knee and irrigate the knee with normal saline solution.  We placed Marcaine with epinephrine around the arthrotomy.  We then dried the knee-72 well and with the knee in a flexed position press-fit our Biomet Zimmer persona tibial tray for right knee size G followed by our size 12 right CR standard femur.  We placed our 12 mm medial congruent right polythene insert and press-fit our size 35 patella button.  With all the real instrumentation and components in the knee we put him through range of motion and we are pleased with stability as well.  We then let the tourniquet down and hemostasis was obtained with electrocautery.  The arthrotomy was then closed with interrupted #1 Vicryl suture followed by 0 Vicryl close the deep tissue and 2-0 Vicryl close subcutaneous tissue.  The skin was closed with staples.  Well-padded sterile dressing was applied.  The patient was taken off of the operating table taken recovery room in stable addition.  Rexene Edison, PA-C did assisted in the entire case and beginning to end and his assistance was crucial and medically necessary for soft tissue management and retraction, helping guide implant placement and a layered closure of the wound.

## 2022-06-09 DIAGNOSIS — M1711 Unilateral primary osteoarthritis, right knee: Secondary | ICD-10-CM | POA: Diagnosis not present

## 2022-06-09 LAB — BASIC METABOLIC PANEL
Anion gap: 10 (ref 5–15)
BUN: 13 mg/dL (ref 8–23)
CO2: 21 mmol/L — ABNORMAL LOW (ref 22–32)
Calcium: 8.7 mg/dL — ABNORMAL LOW (ref 8.9–10.3)
Chloride: 106 mmol/L (ref 98–111)
Creatinine, Ser: 1 mg/dL (ref 0.61–1.24)
GFR, Estimated: >60 mL/min (ref >60)
Glucose, Bld: 182 mg/dL — ABNORMAL HIGH (ref 70–99)
Potassium: 4 mmol/L (ref 3.5–5.1)
Sodium: 137 mmol/L (ref 135–145)

## 2022-06-09 LAB — CBC
HCT: 39.6 % (ref 39.0–52.0)
Hemoglobin: 13.4 g/dL (ref 13.0–17.0)
MCH: 31.6 pg (ref 26.0–34.0)
MCHC: 33.8 g/dL (ref 30.0–36.0)
MCV: 93.4 fL (ref 80.0–100.0)
Platelets: 124 10*3/uL — ABNORMAL LOW (ref 150–400)
RBC: 4.24 MIL/uL (ref 4.22–5.81)
RDW: 12.7 % (ref 11.5–15.5)
WBC: 10.1 10*3/uL (ref 4.0–10.5)
nRBC: 0 % (ref 0.0–0.2)

## 2022-06-09 MED ORDER — ASPIRIN 81 MG PO CHEW: 81.0000 mg | CHEWABLE_TABLET | Freq: Two times a day (BID) | ORAL | 0 refills | Status: AC

## 2022-06-09 MED ORDER — METHOCARBAMOL 500 MG PO TABS: 500.0000 mg | ORAL_TABLET | Freq: Four times a day (QID) | ORAL | 1 refills | Status: AC | PRN

## 2022-06-09 MED ORDER — OXYCODONE HCL 5 MG PO TABS: 5.0000 mg | ORAL_TABLET | Freq: Four times a day (QID) | ORAL | 0 refills | Status: DC | PRN

## 2022-06-09 NOTE — Evaluation (Signed)
Physical Therapy Evaluation Patient Details Name: Jason Barber MRN: [id number] DOB: [birth year] Today's Date: 06/09/2022  History of Present Illness  Pt s/p R TKR and with hx of R THR  Clinical Impression  Pt s/p R TKR and presents with decreased R LE strength/ROM and post op pain limiting functional mobility.  Pt should progress to dc home with family assist.     Recommendations for follow up therapy are one component of a multi-disciplinary discharge planning process, led by the attending physician.  Recommendations may be updated based on patient status, additional functional criteria and insurance authorization.  Follow Up Recommendations       Assistance Recommended at Discharge Intermittent Supervision/Assistance  Patient can return home with the following       Equipment Recommendations None recommended by PT  Recommendations for Other Services       Functional Status Assessment Patient has had a recent decline in their functional status and demonstrates the ability to make significant improvements in function in a reasonable and predictable amount of time.     Precautions / Restrictions Precautions Precautions: Fall;Knee Restrictions Weight Bearing Restrictions: No Other Position/Activity Restrictions: WBAT      Mobility  Bed Mobility Overal bed mobility: Needs Assistance Bed Mobility: Supine to Sit     Supine to sit: Min guard     General bed mobility comments: for safety    Transfers Overall transfer level: Needs assistance Equipment used: Rolling walker (2 wheels) Transfers: Sit to/from Stand Sit to Stand: Min assist           General transfer comment: cues for LE management and use of UEs to self assist    Ambulation/Gait Ambulation/Gait assistance: Min assist Gait Distance (Feet): 100 Feet Assistive device: Rolling walker (2 wheels) Gait Pattern/deviations: Step-to pattern, Decreased step length - right, Decreased step length - left,  Shuffle, Trunk flexed Gait velocity: decr     General Gait Details: cues for sequence, posture and position from [employer]            Wheelchair Mobility    Modified Rankin (Stroke Patients Only)       Balance Overall balance assessment: Needs assistance Sitting-balance support: Feet supported, No upper extremity supported Sitting balance-Leahy Scale: Good     Standing balance support: Bilateral upper extremity supported Standing balance-Leahy Scale: Poor                               Pertinent Vitals/Pain Pain Assessment Pain Assessment: 0-10 Pain Score: 5  Pain Location: R knee Pain Descriptors / Indicators: Aching, Sore Pain Intervention(s): Limited activity within patient's tolerance, Monitored during session, Premedicated before session, Ice applied    Home Living Family/patient expects to be discharged to:: Private residence Living Arrangements: Spouse/significant other Available Help at Discharge: Family;Available 24 hours/day Type of Home: House Home Access: Stairs to enter Entrance Stairs-Rails: Right;Left;Can reach both Entrance Stairs-Number of Steps: 3 Alternate Level Stairs-Number of Steps: flight Home Layout: Able to live on main level with bedroom/bathroom;Two level Home Equipment: Agricultural consultant (2 wheels);BSC/3in1      Prior Function Prior Level of Function : Independent/Modified Independent                     Hand Dominance   Dominant Hand: Right    Extremity/Trunk Assessment   Upper Extremity Assessment Upper Extremity Assessment: Overall WFL for tasks assessed    Lower Extremity Assessment Lower  Extremity Assessment: RLE deficits/detail RLE Deficits / Details: IND SLR with AAROM at knee -5 - 45    Cervical / Trunk Assessment Cervical / Trunk Assessment: Normal  Communication   Communication: No difficulties  Cognition Arousal/Alertness: Awake/alert Behavior During Therapy: WFL for tasks  assessed/performed Overall Cognitive Status: Within Functional Limits for tasks assessed                                          General Comments      Exercises Total Joint Exercises Ankle Circles/Pumps: AROM, Both, 15 reps, Supine Quad Sets: AROM, Both, 10 reps, Supine Heel Slides: AAROM, Right, 15 reps, Supine Straight Leg Raises: AAROM, AROM, Right, 15 reps, Supine   Assessment/Plan    PT Assessment Patient needs continued PT services  PT Problem List Decreased strength;Decreased range of motion;Decreased activity tolerance;Decreased balance;Decreased mobility;Decreased knowledge of use of DME;Pain       PT Treatment Interventions DME instruction;Gait training;Functional mobility training;Stair training;Therapeutic activities;Therapeutic exercise;Patient/family education    PT Goals (Current goals can be found in the Care Plan section)  Acute Rehab PT Goals Patient Stated Goal: Regain IND PT Goal Formulation: With patient Time For Goal Achievement: 06/15/22 Potential to Achieve Goals: Good    Frequency 7X/week     Co-evaluation               AM-PAC PT "6 Clicks" Mobility  Outcome Measure Help needed turning from your back to your side while in a flat bed without using bedrails?: A Little Help needed moving from lying on your back to sitting on the side of a flat bed without using bedrails?: A Little Help needed moving to and from a bed to a chair (including a wheelchair)?: A Little Help needed standing up from a chair using your arms (e.g., wheelchair or bedside chair)?: A Little Help needed to walk in hospital room?: A Little Help needed climbing 3-5 steps with a railing? : A Little 6 Click Score: 18    End of Session Equipment Utilized During Treatment: Gait belt Activity Tolerance: Patient tolerated treatment well Patient left: in chair;with call bell/phone within reach Nurse Communication: Mobility status PT Visit Diagnosis:  Difficulty in walking, not elsewhere classified (R26.2)    Time: 7253-6644 PT Time Calculation (min) (ACUTE ONLY): 42 min   Charges:   PT Evaluation $PT Eval Low Complexity: 1 Low PT Treatments $Gait Training: 8-22 mins $Therapeutic Exercise: 8-22 mins        Jason Barber PT Acute Rehabilitation Services Pager [phone] Office [phone]   Jason Barber 06/09/2022, 11:32 AM

## 2022-06-09 NOTE — TOC Transition Note (Signed)
Transition of Care [facility]) - CM/SW Discharge Note  Patient Details  Name: Jason Barber MRN: [id number] Date of Birth: [birth year]  [facility] CM/SW Contact:  Ewing Schlein, LCSW Phone Number: 06/09/2022, 10:52 AM  Clinical Narrative: Patient is expected to discharge home after working with PT. CSW met with patient to confirm discharge plan. Patient will go home with HHPT, which was prearranged with [facility]. Patient has a rolling walker and BSC at home and will take home the ice machine, so there are no DME needs at this time. TOC signing off.   Final next level of care: Home w Home Health Services Barriers to Discharge: No Barriers Identified  Patient Goals and CMS Choice CMS Medicare.gov Compare Post Acute Care list provided to:: Patient Choice offered to / list presented to : Patient  Discharge Plan and Services Additional resources added to the After Visit Summary for         DME Arranged: N/A DME Agency: NA HH Arranged: PT HH Agency: Optometrist spoke with at [facility] Agency: Prearranged in orthopedist's office  Social Determinants of Health (SDOH) Interventions SDOH Screenings   Food Insecurity: No Food Insecurity (06/08/2022)  Housing: Low Risk  (06/08/2022)  Transportation Needs: No Transportation Needs (06/08/2022)  Utilities: Not At Risk (06/08/2022)  Tobacco Use: Low Risk  (06/08/2022)   Readmission Risk Interventions     No data to display

## 2022-06-09 NOTE — Progress Notes (Signed)
Nurse reviewed discharge instructions with pt. Pt verbalized understanding of discharge instructions, follow up appointment and new medications.  No concerns at time of discharge. 

## 2022-06-09 NOTE — Progress Notes (Signed)
Subjective: 1 Day Post-Op Procedure(s) (LRB): RIGHT TOTAL KNEE ARTHROPLASTY (Right) Patient reports pain as moderate.    Objective: Vital signs in last 24 hours: Temp:  [97.4 F (36.3 C)-98.3 F (36.8 C)] 97.7 F (36.5 C) (04/27 0935) Pulse Rate:  [56-92] 72 (04/27 0935) Resp:  [12-18] 18 (04/27 0935) BP: (114-154)/(63-98) 114/77 (04/27 0935) SpO2:  [91 %-100 %] 99 % (04/27 0935) Weight:  [113.4 kg] 113.4 kg (04/26 1158)  Intake/Output from previous day: 04/26 0701 - 04/27 0700 In: 3053.8 [P.O.:1090; I.V.:1663.8; IV Piggyback:300] Out: 2275 [Urine:2075; Blood:200] Intake/Output this shift: Total I/O In: 600 [P.O.:600] Out: -   Recent Labs    06/09/22 0354  HGB 13.4   Recent Labs    06/09/22 0354  WBC 10.1  RBC 4.24  HCT 39.6  PLT 124*   Recent Labs    06/09/22 0354  NA 137  K 4.0  CL 106  CO2 21*  BUN 13  CREATININE 1.00  GLUCOSE 182*  CALCIUM 8.7*   No results for input(s): "LABPT", "INR" in the last 72 hours.  Sensation intact distally Intact pulses distally Dorsiflexion/Plantar flexion intact Incision: dressing C/D/I No cellulitis present Compartment soft   Assessment/Plan: 1 Day Post-Op Procedure(s) (LRB): RIGHT TOTAL KNEE ARTHROPLASTY (Right) Up with therapy Discharge home with home health      Kathryne Hitch 06/09/2022, 11:03 AM

## 2022-06-09 NOTE — Discharge Summary (Signed)
Patient ID: Jason Barber MRN: [id number] DOB/AGE: 1950-10-25 72 y.o.  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 male who was admitted 06/08/2022 for operative treatment ofUnilateral primary osteoarthritis, right knee. Patient has severe unremitting pain that affects sleep, daily activities, and work/hobbies. After pre-op clearance the patient was taken to the operating room on 06/08/2022 and underwent  Procedure(s): RIGHT TOTAL KNEE ARTHROPLASTY.    Patient was given perioperative antibiotics:  Anti-infectives (From admission, onward)    Start     Dose/Rate Route Frequency Ordered Stop   06/08/22 2000  ceFAZolin (ANCEF) IVPB 1 g/50 mL premix        1 g 100 mL/hr over 30 Minutes Intravenous Every 6 hours 06/08/22 1830 06/09/22 0208   06/08/22 1200  ceFAZolin (ANCEF) IVPB 2g/100 mL premix        2 g 200 mL/hr over 30 Minutes Intravenous On call to O.R. 06/08/22 1146 06/08/22 1402        Patient was given sequential compression devices, early ambulation, and chemoprophylaxis to prevent DVT.  Patient benefited maximally from hospital stay and there were no complications.    Recent vital signs: Patient Vitals for the past 24 hrs:  BP Temp Temp src Pulse Resp SpO2 Height Weight  06/09/22 0935 114/77 97.7 F (36.5 C) -- 72 18 99 % -- --  06/09/22 0502 123/80 97.8 F (36.6 C) Oral 75 18 93 % -- --  06/09/22 0106 128/86 98  F (36.7 C) Oral 85 18 93 % -- --  06/08/22 2213 135/87 98.3 F (36.8 C) Oral 92 18 94 % -- --  06/08/22 1843 132/87 98 F (36.7 C) Oral 74 16 98 % -- --  06/08/22 1815 (!) 142/81 -- -- 61 16 98 % -- --  06/08/22 1800 (!) 141/92 98.1 F (36.7 C) -- 65 14 98 % -- --  06/08/22 1745 139/79 -- -- (!) 59 14 99 % -- --  06/08/22 1730 (!) 133/93 -- -- (!) 59 14 98 % -- --  06/08/22 1715 (!) 133/93 -- -- (!) 59 16 100 % -- --  06/08/22 1700 123/63 98 F (36.7 C) -- (!) 57 12 98 % -- --  06/08/22 1645 (!) 142/93 -- -- 61 12 95 % -- --  06/08/22 1630 (!) 144/83 -- -- (!) 57 12 93 % -- --  06/08/22 1615 134/84 -- -- (!) 58 12 93 % -- --  06/08/22 1600 (!) 141/89 -- -- 61 18 92 % -- --  06/08/22 1549 (!) 140/90 (!) 97.5 F (36.4 C) -- 65 16 96 % -- --  06/08/22 1316 117/77 -- -- (!) 56 14 91 % -- --  06/08/22 1311 -- -- -- (!) 57 14 98 % -- --  06/08/22 1300 (!) 136/98 -- -- -- -- -- -- --  06/08/22 1158 -- -- -- -- -- -- 6\' 2"  (  1.88 m) 113.4 kg  06/08/22 1146 (!) 154/84 (!) 97.4 F (36.3 C) Oral 73 16 98 % -- --     Recent laboratory studies:  Recent Labs    06/09/22 0354  WBC 10.1  HGB 13.4  HCT 39.6  PLT 124*  NA 137  K 4.0  CL 106  CO2 21*  BUN 13  CREATININE 1.00  GLUCOSE 182*  CALCIUM 8.7*     Discharge Medications:   Allergies as of 06/09/2022   No Known Allergies      Medication List     TAKE these medications    aspirin 81 MG chewable tablet Chew 1 tablet (81 mg total) by mouth 2 (two) times daily.   gabapentin 300 MG capsule Commonly known as: NEURONTIN Take 300 mg by mouth at bedtime.   methocarbamol 500 MG tablet Commonly known as: ROBAXIN Take 1 tablet (500 mg total) by mouth every 6 (six) hours as needed for muscle spasms.   naproxen 500 MG tablet Commonly known as: NAPROSYN Take 500 mg by mouth 2 (two) times daily as needed (pain.).   omeprazole 40 MG capsule Commonly known as: PRILOSEC Take 40 mg by mouth daily as needed (acid reflux).    oxyCODONE 5 MG immediate release tablet Commonly known as: Oxy IR/ROXICODONE Take 1-2 tablets (5-10 mg total) by mouth every 6 (six) hours as needed for moderate pain (pain score 4-6).   traZODone 150 MG tablet Commonly known as: DESYREL Take 150 mg by mouth at bedtime.               Durable Medical Equipment  (From admission, onward)           Start     Ordered   06/08/22 1831  DME 3 n 1  Once        06/08/22 1830   06/08/22 1831  DME Walker rolling  Once       Question Answer Comment  Walker: With 5 Inch Wheels   Patient needs a walker to treat with the following condition Status post total right knee replacement      06/08/22 1830            Diagnostic Studies: DG Knee Right Port  Result Date: 06/08/2022 CLINICAL DATA:  Status post total right knee arthroplasty. EXAM: PORTABLE RIGHT KNEE - 1-2 VIEW COMPARISON:  Right knee radiographs 03/28/2022 FINDINGS: Interval total right knee arthroplasty. No perihardware lucency is seen to indicate hardware failure or loosening. Expected postoperative changes including moderate joint effusion, intra-articular air, and subcutaneous air. Anterior surgical skin staples. No acute fracture or dislocation. IMPRESSION: Interval total right knee arthroplasty without evidence of hardware failure. Electronically Signed   By: Neita Garnet M.D.   On: 06/08/2022 16:57    Disposition: Discharge disposition: 01-Home or Self Care          Follow-up Information     Kathryne Hitch, MD Follow up in 2 week(s).   Specialty: Orthopedic Surgery Contact information: [address] [place] [address] [phone]         [facility] Follow up.   Specialty: Home Health Services Why: [facility] will provide PT in the home after discharge. Contact information: [address] [place] [address] [phone]                  Signed: Kathryne Hitch 06/09/2022, 11:14 AM

## 2022-06-09 NOTE — Discharge Instructions (Signed)

## 2022-06-09 NOTE — Progress Notes (Signed)
Physical Therapy Treatment Patient Details Name: Jason Barber MRN: [id number] DOB: June 04, 1950 Today's Date: 06/09/2022   History of Present Illness Pt s/p R TKR and with hx of R THR    PT Comments    Pt continues very motivated but requires cues to slow for safety.  Pt up to ambulate limited distance in hall, negotiated stairs and performed HEP with written instruction provided and reviewed.  Pt reports increased pain this pm - states he twisted knee transferring self chair to bed unassisted - RN aware.  Pt declines to attempt flight of stairs to access basement.  Pt settled in bed and ice applied.  Pt eager for dc home this date.   Recommendations for follow up therapy are one component of a multi-disciplinary discharge planning process, led by the attending physician.  Recommendations may be updated based on patient status, additional functional criteria and insurance authorization.  Follow Up Recommendations       Assistance Recommended at Discharge Intermittent Supervision/Assistance  Patient can return home with the following     Equipment Recommendations  None recommended by PT    Recommendations for Other Services       Precautions / Restrictions Precautions Precautions: Fall;Knee Restrictions Weight Bearing Restrictions: No Other Position/Activity Restrictions: WBAT     Mobility  Bed Mobility Overal bed mobility: Needs Assistance Bed Mobility: Sit to Supine     Supine to sit: Min guard Sit to supine: Supervision   General bed mobility comments: for safety    Transfers Overall transfer level: Needs assistance Equipment used: Rolling walker (2 wheels) Transfers: Sit to/from Stand Sit to Stand: Supervision           General transfer comment: cues for LE management and use of UEs to self assist    Ambulation/Gait Ambulation/Gait assistance: Min guard, Supervision Gait Distance (Feet): 70 Feet Assistive device: Rolling walker (2 wheels) Gait  Pattern/deviations: Step-to pattern, Decreased step length - right, Decreased step length - left, Shuffle, Trunk flexed Gait velocity: decr     General Gait Details: cues for sequence, posture and position from RW   Stairs Stairs: Yes Stairs assistance: Min guard Stair Management: Two rails, Step to pattern, Forwards Number of Stairs: 3 General stair comments: Cues for sequence on stairs   Wheelchair Mobility    Modified Rankin (Stroke Patients Only)       Balance Overall balance assessment: Needs assistance Sitting-balance support: Feet supported, No upper extremity supported Sitting balance-Leahy Scale: Good     Standing balance support: Single extremity supported Standing balance-Leahy Scale: Fair                              Cognition Arousal/Alertness: Awake/alert Behavior During Therapy: WFL for tasks assessed/performed Overall Cognitive Status: Within Functional Limits for tasks assessed                                          Exercises Total Joint Exercises Ankle Circles/Pumps: AROM, Both, 15 reps, Supine Quad Sets: AROM, Both, 10 reps, Supine Heel Slides: AAROM, Right, 15 reps, Supine Straight Leg Raises: AAROM, AROM, Right, 15 reps, Supine    General Comments        Pertinent Vitals/Pain Pain Assessment Pain Assessment: 0-10 Pain Score: 6  Pain Location: R knee Pain Descriptors / Indicators: Aching, Sore, Grimacing Pain Intervention(s): Limited activity  within patient's tolerance, Monitored during session, Premedicated before session, Ice applied    Home Living Family/patient expects to be discharged to:: Private residence Living Arrangements: Spouse/significant other Available Help at Discharge: Family;Available 24 hours/day Type of Home: House Home Access: Stairs to enter Entrance Stairs-Rails: Right;Left;Can reach both Entrance Stairs-Number of Steps: 3 Alternate Level Stairs-Number of Steps: flight Home  Layout: Able to live on main level with bedroom/bathroom;Two level Home Equipment: Agricultural consultant (2 wheels);BSC/3in1      Prior Function            PT Goals (current goals can now be found in the care plan section) Acute Rehab PT Goals Patient Stated Goal: Regain IND PT Goal Formulation: With patient Time For Goal Achievement: 06/15/22 Potential to Achieve Goals: Good Progress towards PT goals: Progressing toward goals    Frequency    7X/week      PT Plan Current plan remains appropriate    Co-evaluation              AM-PAC PT "6 Clicks" Mobility   Outcome Measure  Help needed turning from your back to your side while in a flat bed without using bedrails?: A Little Help needed moving from lying on your back to sitting on the side of a flat bed without using bedrails?: A Little Help needed moving to and from a bed to a chair (including a wheelchair)?: A Little Help needed standing up from a chair using your arms (e.g., wheelchair or bedside chair)?: A Little Help needed to walk in hospital room?: A Little Help needed climbing 3-5 steps with a railing? : A Little 6 Click Score: 18    End of Session Equipment Utilized During Treatment: Gait belt Activity Tolerance: Patient tolerated treatment well Patient left: in bed;with call bell/phone within reach;with bed alarm set Nurse Communication: Mobility status PT Visit Diagnosis: Difficulty in walking, not elsewhere classified (R26.2)     Time: 6295-2841 PT Time Calculation (min) (ACUTE ONLY): 29 min  Charges:  $Gait Training: 8-22 mins $Therapeutic Exercise: 8-22 mins                     Mauro Kaufmann PT Acute Rehabilitation Services Pager [phone] Office [phone]    [facility] 06/09/2022, 12:37 PM

## 2022-06-11 ENCOUNTER — Encounter (HOSPITAL_COMMUNITY): Payer: Self-pay | Admitting: Orthopaedic Surgery

## 2022-06-13 ENCOUNTER — Telehealth: Payer: Self-pay | Admitting: Orthopaedic Surgery

## 2022-06-13 NOTE — Telephone Encounter (Signed)
Patient just had surgery and was told by his PT nurse he needs compression socks his leg is starting to swell. Please advise

## 2022-06-14 ENCOUNTER — Telehealth: Payer: Self-pay

## 2022-06-14 NOTE — Telephone Encounter (Signed)
Patient aware.

## 2022-06-14 NOTE — Telephone Encounter (Signed)
Patient aware of the below message  

## 2022-06-14 NOTE — Telephone Encounter (Signed)
[facility] called stating patient is developing a rash, thought it may be from his oxycodone Also, he is riding his lawnmower, they told him maybe not do that just yet.

## 2022-06-21 ENCOUNTER — Encounter: Payer: Self-pay | Admitting: Orthopaedic Surgery

## 2022-06-21 ENCOUNTER — Telehealth: Payer: Self-pay | Admitting: Orthopaedic Surgery

## 2022-06-21 ENCOUNTER — Ambulatory Visit: Payer: No Typology Code available for payment source | Admitting: Orthopaedic Surgery

## 2022-06-21 ENCOUNTER — Telehealth: Payer: Self-pay

## 2022-06-21 DIAGNOSIS — Z96651 Presence of right artificial knee joint: Secondary | ICD-10-CM

## 2022-06-21 MED ORDER — HYDROCODONE-ACETAMINOPHEN 5-325 MG PO TABS: 1.0000 | ORAL_TABLET | Freq: Four times a day (QID) | ORAL | 0 refills | Status: AC | PRN

## 2022-06-21 NOTE — Telephone Encounter (Signed)
Patient seen today by Dr Magnus Ivan.  Stated that he was told by [facility] with the [facility] that anything he needed they would get for him.  I called and LMVM for Tresa Endo at [phone] (name and number provided by patient) and asked her to please call me back to let me know what we needed to do to get a cane for patient to assist with ambulation s/p TKA

## 2022-06-21 NOTE — Telephone Encounter (Signed)
Spoke with Tresa Endo at [facility]	[place] [facility] and she said she can work on putting in that order but she would like for [employer] [facility] to call her back please advise

## 2022-06-21 NOTE — Telephone Encounter (Signed)
Tried calling back

## 2022-06-21 NOTE — Progress Notes (Signed)
The patient is a 72 year old is here today for his first postoperative visit status post a right total knee arthroplasty.  He is doing well overall.  He has been compliant with a baby aspirin twice daily and has been having home health therapy.  He is ready to transition to outpatient physical therapy.  Examination of his right knee shows he has almost full extension and I can flex him to about 80 degrees just beyond that.  His calf is soft.  His incision looks good.  I remove the staples and place Steri-Strips.  He is neurovascularly intact.  He will transition to outpatient physical therapy and I gave him a prescription for [facility] physical therapy which she will call.  I will send in some hydrocodone for pain.  He can continue the naproxen as well and stop his aspirin.  We will see him back in 4 weeks for repeat exam but no x-rays are needed.

## 2022-06-25 ENCOUNTER — Telehealth: Payer: Self-pay | Admitting: Orthopaedic Surgery

## 2022-06-25 NOTE — Telephone Encounter (Signed)
Patient states he was given paperwork and it was incomplete and need to have someone to call him and get paperwork completed before the [facility] to pay. Please advise also patient states to call the [place] --[phone] ext [phone]

## 2022-06-25 NOTE — Telephone Encounter (Signed)
Patient states he has the cane

## 2022-06-27 ENCOUNTER — Telehealth: Payer: Self-pay | Admitting: Orthopaedic Surgery

## 2022-06-27 ENCOUNTER — Other Ambulatory Visit: Payer: Self-pay

## 2022-06-27 DIAGNOSIS — Z96651 Presence of right artificial knee joint: Secondary | ICD-10-CM

## 2022-06-27 NOTE — Telephone Encounter (Signed)
LMOM for patient that I am unsure of what he is asking for I have called the [facility] and they are also unsure

## 2022-06-27 NOTE — Telephone Encounter (Signed)
[facility] would like referral for PT faxed over to [phone]

## 2022-06-27 NOTE — Telephone Encounter (Signed)
Referral sent 

## 2022-07-23 ENCOUNTER — Ambulatory Visit: Payer: No Typology Code available for payment source | Admitting: Orthopaedic Surgery

## 2022-07-23 ENCOUNTER — Encounter: Payer: Self-pay | Admitting: Orthopaedic Surgery

## 2022-07-23 DIAGNOSIS — Z96651 Presence of right artificial knee joint: Secondary | ICD-10-CM

## 2022-07-23 NOTE — Progress Notes (Signed)
The patient is now 6-week status post a right total knee arthroplasty.  He reports excellent range of motion and good mobility.  He is still having some pain at night to be expected but he just takes ibuprofen.  We replaced his right hip remotely.  He has had several falls and was seen at the [place] recently and they did place a steroid injection over the right hip trochanteric.  He said that is done great for him.  His notes from physical therapy also attest to the fact that he is flexing and extending his right knee well.  He is 71 and active.  He is walking without assistive device.  His right knee does have some swelling to be expected but the range of motion is improving significantly through his arc of motion.  He lacks full extension by just a few degrees.  The knee feels mostly stable and I can flex him to at least 115 degrees.  He will continue to increase his activities as he tolerates.  Will see him back in 6 weeks to see how he is doing overall and we will have an AP and lateral of his right knee at that visit.

## 2022-08-13 ENCOUNTER — Telehealth: Payer: Self-pay | Admitting: Physical Medicine and Rehabilitation

## 2022-08-13 NOTE — Telephone Encounter (Signed)
Patient called. Would like an appointment with Dr. Alvester Morin in his back. His call back number is [phone]

## 2022-08-17 NOTE — Telephone Encounter (Signed)
Spoke with patient and he stated the [facility] wanted him to see a [facility]iatrist for back injections. He stated it has not been authorized through the [place] yet. Informed patient that I cannot schedule an appointment until the referral is sent over to the clinic. He stated he will call and try to get them to send it over.

## 2022-08-21 ENCOUNTER — Telehealth: Payer: Self-pay | Admitting: Physical Medicine and Rehabilitation

## 2022-08-22 ENCOUNTER — Ambulatory Visit: Payer: PPO | Admitting: Orthopedic Surgery

## 2022-08-23 ENCOUNTER — Encounter: Payer: Self-pay | Admitting: Physical Medicine and Rehabilitation

## 2022-08-23 ENCOUNTER — Ambulatory Visit: Payer: No Typology Code available for payment source | Admitting: Physical Medicine and Rehabilitation

## 2022-08-23 DIAGNOSIS — G8929 Other chronic pain: Secondary | ICD-10-CM

## 2022-08-23 DIAGNOSIS — M5416 Radiculopathy, lumbar region: Secondary | ICD-10-CM | POA: Diagnosis not present

## 2022-08-23 DIAGNOSIS — M47816 Spondylosis without myelopathy or radiculopathy, lumbar region: Secondary | ICD-10-CM | POA: Diagnosis not present

## 2022-08-23 DIAGNOSIS — M5441 Lumbago with sciatica, right side: Secondary | ICD-10-CM

## 2022-08-23 NOTE — Progress Notes (Signed)
Jason Barber - 72 y.o. male MRN [id number]  Date of birth: [birth year]  Office Visit Note: Visit Date: 08/23/2022 PCP: [facility] Referred by: Roe Coombs*  Subjective: Chief Complaint  Patient presents with   Lower Back - Pain   HPI: Jason Barber is a 72 y.o. male who comes in today per the request of Arsenio Loader, [place] with [facility] for evaluation of chronic, worsening and severe right sided lower back pain radiating to buttock/lateral hip. Intermittent radiation of pain down right lateral thigh down to knee. States pain worsens with movement/activity and prolonged sitting. States he has to change positions frequently to avoid severe discomfort. He describes pain as pressure and sore sensation, currently rates as 1 out of 10. Some relief of pain with home exercise regimen, rest and use of medications. Recent lumbar x-rays from [place] exhibits mild middle and moderate lower lumbar spondylosis. He recently [facility], no relief of pain with this procedure. No prior lumbar surgery/injections. History of right total hip arthroplasty in 2021 and right total knee arthroplasty in 2024, both with Dr. Doneen Poisson.  Patient denies focal weakness, numbness and tingling. No recent trauma or falls.    Oswestry Disability Index Score 32% 10 to 20 (40%) moderate disability: The patient experiences more pain and difficulty with sitting, lifting and standing. Travel and social life are more difficult, and they may be disabled from work. Personal care, sexual activity and sleeping are not grossly affected, and the patient can usually be managed by conservative means.  Review of Systems  Musculoskeletal:  Positive for back pain.  Neurological:  Negative for tingling, sensory change, focal weakness and weakness.  All other systems reviewed and are negative.  Otherwise per HPI.  Assessment &  Plan: Visit Diagnoses:    ICD-10-CM   1. Chronic right-sided low back pain with right-sided sciatica  M54.41 MR LUMBAR SPINE WO CONTRAST   G89.29     2. Lumbar radiculopathy  M54.16 MR LUMBAR SPINE WO CONTRAST    3. Facet arthropathy, lumbar  M47.816 MR LUMBAR SPINE WO CONTRAST       Plan: Findings:  Chronic, worsening and severe right sided lower back pain radiating to buttock/lateral hip, intermittent radiation of pain down right lateral thigh to knee. Patient continues to have severe pain despite good conservative therapies such as home exercise regimen, rest and use of medications. Patients clinical presentation and exam are consistent with L5 nerve pattern. He does have some tenderness to right greater trochanter upon palpation, however no relief of pain with recent GT injection. Next step is to obtain lumbar MRI imaging. Depending on results of imaging I discussed possibility of undergoing lumbar epidural steroid injection. I discussed lumbar epidural steroid injection procedure in detail, he has no questions at this time. I also discussed medication management and other possible treatments. Patient voiced he is not interested in taking medications or surgery at this time. I will have patient follow up for lumbar MRI review. I encouraged him to remain active as tolerated. No signs of distress noted.     Meds & Orders: No orders of the defined types were placed in this encounter.   Orders Placed This Encounter  Procedures   MR LUMBAR SPINE WO CONTRAST    Follow-up: Return for lumbar MRI follow up.   Procedures: No procedures performed      Clinical History: No specialty comments available.   He reports that  he has never smoked. He has never used smokeless tobacco. No results for input(s): "HGBA1C", "LABURIC" in the last 8760 hours.  Objective:  VS:  HT:    WT:   BMI:     BP:   HR: bpm  TEMP: ( )  RESP:  Physical Exam Vitals and nursing note reviewed.  HENT:     Head:  Normocephalic and atraumatic.     Right Ear: External ear normal.     Left Ear: External ear normal.     Nose: Nose normal.     Mouth/Throat:     Mouth: Mucous membranes are moist.  Eyes:     Extraocular Movements: Extraocular movements intact.  Cardiovascular:     Rate and Rhythm: Normal rate.     Pulses: Normal pulses.  Pulmonary:     Effort: Pulmonary effort is normal.  Abdominal:     General: Abdomen is flat. There is no distension.  Musculoskeletal:        General: Tenderness present.     Cervical back: Normal range of motion.     Comments: Patient rises from seated position to standing without difficulty. Good lumbar range of motion. No pain noted with facet loading. 5/5 strength noted with bilateral hip flexion, knee flexion/extension, ankle dorsiflexion/plantarflexion and EHL. No clonus noted bilaterally. No pain upon palpation of greater trochanters. No pain with internal/external rotation of bilateral hips. Sensation intact bilaterally. Dysesthesias noted to right L5 dermatome. Negative slump test bilaterally. Ambulates without aid, gait steady.     Skin:    General: Skin is warm and dry.     Capillary Refill: Capillary refill takes less than 2 seconds.  Neurological:     General: No focal deficit present.     Mental Status: He is alert and oriented to person, place, and time.  Psychiatric:        Mood and Affect: Mood normal.        Behavior: Behavior normal.     Ortho Exam  Imaging: No results found.  Past Medical/Family/Surgical/Social History: Medications & Allergies reviewed per EMR, new medications updated. 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 No pertinent family history. Past Surgical History:  Procedure Laterality Date   KNEE ARTHROSCOPY Right 2001   TOTAL HIP ARTHROPLASTY Right 08/14/2019   Procedure: RIGHT TOTAL HIP ARTHROPLASTY ANTERIOR APPROACH;  Surgeon: Kathryne Hitch, MD;  Location: [facility];  Service: Orthopedics;  Laterality: Right;   TOTAL KNEE ARTHROPLASTY Right 06/08/2022   Procedure: RIGHT TOTAL KNEE ARTHROPLASTY;  Surgeon: Kathryne Hitch, MD;  Location: [facility];  Service: Orthopedics;  Laterality: Right;   Social History   Occupational History   Not on file  Tobacco Use   Smoking status: Never   Smokeless tobacco: Never  Vaping Use   Vaping status: Never Used  Substance and Sexual Activity   Alcohol use: Yes    Comment: occational   Drug use: Never   Sexual activity: Not on file

## 2022-08-23 NOTE — Progress Notes (Signed)
Functional Pain Scale - descriptive words and definitions  Uncomfortable (3)  Pain is present but can complete all ADL's/sleep is slightly affected and passive distraction only gives marginal relief. Mild range order  Average Pain  varies  Lower back pain that radiates into the right leg

## 2022-09-03 ENCOUNTER — Other Ambulatory Visit (INDEPENDENT_AMBULATORY_CARE_PROVIDER_SITE_OTHER): Payer: No Typology Code available for payment source

## 2022-09-03 ENCOUNTER — Ambulatory Visit: Payer: No Typology Code available for payment source | Admitting: Orthopaedic Surgery

## 2022-09-03 ENCOUNTER — Encounter: Payer: Self-pay | Admitting: Orthopaedic Surgery

## 2022-09-03 DIAGNOSIS — Z96651 Presence of right artificial knee joint: Secondary | ICD-10-CM | POA: Diagnosis not present

## 2022-09-03 NOTE — Progress Notes (Signed)
Art getting close to 3 months status post a right press-fit knee replacement.  He said the knee is doing well.  He reports good range of motion and strength.  He is dealing more with sciatica and does see Dr. Alvester Morin coming up.  He says his right hip replacement and right knee replacement are doing well.  Examination of his right hip shows a move smoothly and fluidly.  The more recent right knee replacement has full extension and flexion to almost 120 degrees.  His right knee is ligamentously stable.  Swelling is minimal.  His incision is well-healed.  His calf is soft.  2 views of the right knee show well-seated press-fit total knee arthroplasty with no complicating features.  On my standpoint the next time I need to see him is not for 6 months.  At that visit we will have a repeat AP and lateral of his right knee.  If there are issues before then he knows to let us know.

## 2022-09-10 ENCOUNTER — Other Ambulatory Visit: Payer: PPO

## 2022-09-11 ENCOUNTER — Telehealth: Payer: Self-pay

## 2022-09-11 NOTE — Telephone Encounter (Signed)
You're welcome!

## 2022-09-11 NOTE — Telephone Encounter (Signed)
They faxed a request for pts last ov notes, which I faxed. It was for the request for additional services request we faxed. I hope it was okay to just do.

## 2022-09-11 NOTE — Telephone Encounter (Signed)
I got a voicemail from the [place] on this patient, she said she was going to fax me something? Did you see that by chance?

## 2022-10-23 ENCOUNTER — Telehealth: Payer: Self-pay | Admitting: Physical Medicine and Rehabilitation

## 2022-10-23 NOTE — Telephone Encounter (Signed)
Pt called in stated he was never called for imaging from last month please advise

## 2022-10-23 NOTE — Telephone Encounter (Signed)
Pt was scheduled on 07/29 and then was cancelled, pt can call imaging back at [phone] to schedule this appt.

## 2022-10-25 NOTE — Telephone Encounter (Signed)
Spoke with patient and he stated the imaging center cancelled the appointment because they did not have the appropriate paperwork from the [place]. He said they blamed it on the clinic and he wants to know what needs to be done. Can you please look into this for me and give the patient a call to let him know please.

## 2022-10-28 ENCOUNTER — Ambulatory Visit
Admission: RE | Admit: 2022-10-28 | Discharge: 2022-10-28 | Disposition: A | Discharge status: Home / Self Care | Payer: Medicare Other | Source: Ambulatory Visit | Attending: Physical Medicine and Rehabilitation | Admitting: Physical Medicine and Rehabilitation

## 2022-10-28 DIAGNOSIS — M5416 Radiculopathy, lumbar region: Secondary | ICD-10-CM

## 2022-10-28 DIAGNOSIS — G8929 Other chronic pain: Secondary | ICD-10-CM

## 2022-10-28 DIAGNOSIS — M47816 Spondylosis without myelopathy or radiculopathy, lumbar region: Secondary | ICD-10-CM

## 2022-11-16 ENCOUNTER — Telehealth: Payer: Self-pay

## 2022-11-16 ENCOUNTER — Telehealth: Payer: Self-pay | Admitting: Physical Medicine and Rehabilitation

## 2022-11-16 NOTE — Telephone Encounter (Signed)
Pt had MRI on 09/15 and is ready for follow up

## 2022-11-16 NOTE — Telephone Encounter (Signed)
Spoke with patient and scheduled OV for 11/20/22.

## 2022-11-16 NOTE — Telephone Encounter (Signed)
-----   Message from [employer], [place] E sent at 11/16/2022  9:09 AM EDT ----- Can we schedule lumbar MRI review? Thanks ----- Message ----- From: Interface, Rad Results In Sent: 11/16/2022   9:00 AM EDT To: Juanda Chance, NP

## 2022-11-20 ENCOUNTER — Encounter: Payer: Self-pay | Admitting: Physical Medicine and Rehabilitation

## 2022-11-20 ENCOUNTER — Ambulatory Visit (INDEPENDENT_AMBULATORY_CARE_PROVIDER_SITE_OTHER): Payer: No Typology Code available for payment source | Admitting: Physical Medicine and Rehabilitation

## 2022-11-20 VITALS — BP 134/87 | HR 67

## 2022-11-20 DIAGNOSIS — M48061 Spinal stenosis, lumbar region without neurogenic claudication: Secondary | ICD-10-CM | POA: Diagnosis not present

## 2022-11-20 DIAGNOSIS — M5416 Radiculopathy, lumbar region: Secondary | ICD-10-CM

## 2022-11-20 DIAGNOSIS — M47816 Spondylosis without myelopathy or radiculopathy, lumbar region: Secondary | ICD-10-CM | POA: Diagnosis not present

## 2022-11-20 NOTE — Progress Notes (Unsigned)
Jason Barber - 72 y.o. male MRN [id number]  Date of birth: 02-Aug-1950  Office Visit Note: Visit Date: 11/20/2022 PCP: [facility] Referred by: Roe Coombs*  Subjective: Chief Complaint  Patient presents with   Lower Back - Pain   Right Leg - Pain   HPI: Jason Barber is a 72 y.o. male who comes in today for evaluation of chronic, worsening and severe bilateral lower back pain, intermittent radiation of pain to right buttock/hip. Pain ongoing for several years, worsens with standing. 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 States he is not able to stand for long period of time. He can walk long distances but lower back pain worsens after several minutes of standing. He describes his pain as pressure, soreness and aching, currently rates as 6 out of 10. Some relief of pain with home exercise regimen, rest and use of medications. No history of formal physical therapy. Recent lumbar MRI imaging exhibits left subarticular disc protrusion at L4-L5 with possible impingement of the left intraspinal L5 nerve root, there is also moderate right foraminal stenosis at this level. Moderate bilateral facet arthropathy at L1-L2 and L2-L3. No high grade spinal canal stenosis noted. History of right total hip arthroplasty in 2021 and right total knee arthroplasty in 2024, both with Dr. Doneen Poisson. Patient denies focal weakness, numbness and tingling. No recent trauma or falls.              Review of Systems  Musculoskeletal:  Positive for back pain.  Neurological:  Negative for tingling, sensory change, focal weakness and weakness.  All other systems reviewed and are negative.  Otherwise per HPI.  Assessment & Plan: Visit Diagnoses:    ICD-10-CM   1. Lumbar radiculopathy  M54.16 Ambulatory referral to Physical Medicine Rehab    2. Stenosis of lateral recess of  lumbar spine  M48.061 Ambulatory referral to Physical Medicine Rehab    3. Facet arthropathy, lumbar  M47.816 Ambulatory referral to Physical Medicine Rehab       Plan: Findings:  Chronic, worsening and severe bilateral lower back pain, intermittent radiation of pain to right buttock/hip. Patient continues to have severe pain despite good conservative therapies such as home exercise regimen, rest and use of medications. Patients clinical presentation and exam are complex, differentials include lumbar radiculopathy vs facet mediated pain. His pain pattern does seem to be more L5 nerve distrubution. Upon our interpretation of recent lumbar MRI imaging there is bilateral lateral recess stenosis at L3-L4 and facet arthropathy at L4-L5. Narrowing is mostly left sided which does not directly correlate with his symptoms. We discussed treatment plan in detail today, next step is to perform diagnostic and hopefully right L3-L4 interlaminar epidural steroid injection under fluoroscopic guidance. He is not currently taking anticoagulant medications. If good relief of pain we can repeat injection procedure infrequently as needed. If axial back pain remains post injection we would consider performing facet joint injections. Dr. Alvester Morin at bedside to discuss injection procedure, he has no questions at this time. Would also consider re-grouping with physical therapy. I encouraged patient to remain active as tolerated. No red flag symptoms noted upon exam today.     Meds & Orders: No orders of the defined types were placed in this encounter.   Orders Placed This Encounter  Procedures   Ambulatory referral to Physical Medicine Rehab    Follow-up: Return for  Right L3-L4 interlaminar epidural steroid injection.   Procedures: No procedures performed      Clinical History: Narrative & Impression CLINICAL DATA:  Low back pain for 2-3 years.  Right-sided weakness.   EXAM: MRI LUMBAR SPINE WITHOUT CONTRAST    TECHNIQUE: Multiplanar, multisequence MR imaging of the lumbar spine was performed. No intravenous contrast was administered.   COMPARISON:  None Available.   FINDINGS: Segmentation:  Standard.   Alignment:  Physiologic.   Vertebrae: No acute fracture, evidence of discitis, or aggressive bone lesion.   Conus medullaris and cauda equina: Conus extends to the L1 level. Conus and cauda equina appear normal.   Paraspinal and other soft tissues: No acute paraspinal abnormality. Large partially visualized left renal cyst.   Disc levels:   Disc spaces: Degenerative disease with disc height loss at T11-12, T12-L1, L4-5 and L5-S1.   T12-L1: Mild broad-based disc bulge. Mild bilateral facet arthropathy. Mild left foraminal stenosis. Moderate right foraminal stenosis. No spinal stenosis.   L1-L2: Mild broad-based disc bulge. Moderate bilateral facet arthropathy. No right foraminal stenosis. Mild left foraminal stenosis. No spinal stenosis.   L2-L3: Mild broad-based disc bulge. Moderate bilateral facet arthropathy, right worse than left. Minimal spinal stenosis. Mild-moderate bilateral foraminal stenosis.   L3-L4: Broad-based disc bulge. Mild bilateral facet arthropathy. No spinal stenosis. Moderate bilateral foraminal stenosis.   L4-L5: Broad-based disc bulge with a left subarticular disc protrusion with possible impingement of the left intraspinal L5 nerve root. Left subarticular recess stenosis. Moderate-severe left foraminal stenosis. Moderate right foraminal stenosis. No spinal stenosis.   L5-S1: Minimal broad-based disc bulge. No foraminal or central canal stenosis.   IMPRESSION: 1. Lumbar spine spondylosis as described above. 2. No acute osseous injury of the lumbar spine.     Electronically Signed   By: Elige Ko M.D.   On: 11/16/2022 08:58   He reports that he has never smoked. He has never used smokeless tobacco. No results for input(s): "HGBA1C",  "LABURIC" in the last 8760 hours.  Objective:  VS:  HT:    WT:   BMI:     BP:134/87  HR:67bpm  TEMP: ( )  RESP:  Physical Exam Vitals and nursing note reviewed.  HENT:     Head: Normocephalic and atraumatic.     Right Ear: External ear normal.     Left Ear: External ear normal.     Nose: Nose normal.     Mouth/Throat:     Mouth: Mucous membranes are moist.  Eyes:     Extraocular Movements: Extraocular movements intact.  Cardiovascular:     Rate and Rhythm: Normal rate.     Pulses: Normal pulses.  Pulmonary:     Effort: Pulmonary effort is normal.  Abdominal:     General: Abdomen is flat. There is no distension.  Musculoskeletal:        General: Tenderness present.     Cervical back: Normal range of motion.     Comments: Patient rises from seated position to standing without difficulty. Good lumbar range of motion. No pain noted with facet loading. 5/5 strength noted with bilateral hip flexion, knee flexion/extension, ankle dorsiflexion/plantarflexion and EHL. No clonus noted bilaterally. No pain upon palpation of greater trochanters. No pain with internal/external rotation of bilateral hips. Sensation intact bilaterally. Dysesthesias noted to right L5 dermatomes. Negative slump test bilaterally. Ambulates without aid, gait steady.     Skin:    General: Skin is warm and dry.     Capillary Refill: Capillary refill  takes less than 2 seconds.  Neurological:     General: No focal deficit present.     Mental Status: He is alert and oriented to person, place, and time.  Psychiatric:        Mood and Affect: Mood normal.        Behavior: Behavior normal.     Ortho Exam  Imaging: No results found.  Past Medical/Family/Surgical/Social History: Medications & Allergies reviewed per EMR, new medications updated. 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 No pertinent family history. Past Surgical History:  Procedure Laterality Date   KNEE ARTHROSCOPY Right 2001   TOTAL HIP ARTHROPLASTY Right 08/14/2019   Procedure: RIGHT TOTAL HIP ARTHROPLASTY ANTERIOR APPROACH;  Surgeon: Kathryne Hitch, MD;  Location: [facility];  Service: Orthopedics;  Laterality: Right;   TOTAL KNEE ARTHROPLASTY Right 06/08/2022   Procedure: RIGHT TOTAL KNEE ARTHROPLASTY;  Surgeon: Kathryne Hitch, MD;  Location: [facility];  Service: Orthopedics;  Laterality: Right;   Social History   Occupational History   Not on file  Tobacco Use   Smoking status: Never   Smokeless tobacco: Never  Vaping Use   Vaping status: Never Used  Substance and Sexual Activity   Alcohol use: Yes    Comment: occational   Drug use: Never   Sexual activity: Not on file

## 2022-11-20 NOTE — Progress Notes (Unsigned)
Functional Pain Scale - descriptive words and definitions  Moderate (4)   Constantly aware of pain, can complete ADLs with modification/sleep marginally affected at times/passive distraction is of no use, but active distraction gives some relief. Moderate range order  Average Pain 4  Follow up after MRI, unable to stand for long periods of time but does well with walking. Pain in lower back and down right leg.

## 2022-11-27 ENCOUNTER — Other Ambulatory Visit: Payer: Self-pay

## 2022-11-27 ENCOUNTER — Ambulatory Visit: Payer: No Typology Code available for payment source | Admitting: Physical Medicine and Rehabilitation

## 2022-11-27 VITALS — BP 159/81 | HR 72

## 2022-11-27 DIAGNOSIS — M5416 Radiculopathy, lumbar region: Secondary | ICD-10-CM

## 2022-11-27 MED ORDER — METHYLPREDNISOLONE ACETATE 40 MG/ML IJ SUSP
40.0000 mg | Freq: Once | INTRAMUSCULAR | Status: AC
Start: 2022-11-27 — End: 2022-11-27
  Administered 2022-11-27: 40 mg

## 2022-11-27 NOTE — Progress Notes (Signed)
Functional Pain Scale - descriptive words and definitions  Uncomfortable (3)  Pain is present but can complete all ADL's/sleep is slightly affected and passive distraction only gives marginal relief. Mild range order  Average Pain  varies   +Driver, -BT, -Dye Allergies.  Lower back pain on right side

## 2022-11-27 NOTE — Patient Instructions (Signed)
[facility] Physiatry Discharge Instructions  *At any time if you have questions or concerns they can be answered by calling [phone]  All Patients: You may experience an increase in your symptoms for the first 2 days (it can take 2 days to 2 weeks for the steroid/cortisone to have its maximal effect). You may use ice to the site for the first 24 hours; 20 minutes on and 20 minutes off and may use heat after that time. You may resume and continue your current pain medications. If you need a refill please contact the prescribing physician. You may resume your medications if any were stopped for the procedure. You may shower but no swimming, tub bath or Jacuzzi for 24 hours. Please remove bandage after 4 hours. You may resume light activities as tolerated. If you had Spine Injection, you should not drive for the next 3 hours due to anesthetics used in the procedure. Please have someone drive for you.  *If you have had sedation, Valium, Xanax, or lorazepam: Do not drive or use public transportation for 24 hours, do not operating hazardous machinery or make important personal/business decisions for 24 hours.  POSSIBLE STEROID SIDE EFFECTS: If experienced these should only last for a short period. Change in menstrual flow  Edema in (swelling)  Increased appetite Skin flushing (redness)  Skin rash/acne  Thrush (oral) Vaginitis    Increased sweating  Depression Increased blood glucose levels Cramping and leg/calf  Euphoria (feeling happy)  POSSIBLE PROCEDURE SIDE EFFECTS: Please call our office if concerned. Increased pain Increased numbness/tingling  Headache Nausea/vomiting Hematoma (bruising/bleeding) Edema (swelling at the site) Weakness  Infection (red/drainage at site) Fever greater than 100.107F  *In the event of a headache after epidural steroid injection: Drink plenty of fluids, especially water and try to lay flat when possible. If the headache does not get better after a few days  or as always if concerned please call the office.

## 2022-12-11 NOTE — Progress Notes (Signed)
Author A Mcclees - 72 y.o. male MRN [id number]  Date of birth: August 26, 1950  Office Visit Note: Visit Date: 11/27/2022 PCP: [facility] Referred by: Roe Coombs*  Subjective: Chief Complaint  Patient presents with   Lower Back - Pain   HPI:  Thai A Sparkman is a 72 y.o. male who comes in today at the request of Ellin Goodie, FNP for planned Right L3-4 Lumbar Interlaminar epidural steroid injection with fluoroscopic guidance.  The patient has failed conservative care including home exercise, medications, time and activity modification.  This injection will be diagnostic and hopefully therapeutic.  Please see requesting physician notes for further details and justification.   ROS Otherwise per HPI.  Assessment & Plan: Visit Diagnoses:    ICD-10-CM   1. Lumbar radiculopathy  M54.16 XR C-ARM NO REPORT    Epidural Steroid injection    methylPREDNISolone acetate (DEPO-MEDROL) injection 40 mg      Plan: No additional findings.   Meds & Orders:  Meds ordered this encounter  Medications   methylPREDNISolone acetate (DEPO-MEDROL) injection 40 mg    Orders Placed This Encounter  Procedures   XR C-ARM NO REPORT   Epidural Steroid injection    Follow-up: Return for visit to requesting provider as needed.   Procedures: No procedures performed  Lumbar Epidural Steroid Injection - Interlaminar Approach with Fluoroscopic Guidance  Patient: Essex A Eastham      Date of Birth: October 18, 1950 MRN: [id number] PCP: [facility]      Visit Date: 11/27/2022   Universal Protocol:     Consent Given By: the patient  Position: PRONE  Additional Comments: Vital signs were monitored before and after the procedure. Patient was prepped and draped in the usual sterile fashion. The correct patient, procedure, and site was verified.   Injection Procedure Details:   Procedure diagnoses: Lumbar radiculopathy [M54.16]    Meds Administered:  Meds ordered this encounter  Medications   methylPREDNISolone acetate (DEPO-MEDROL) injection 40 mg     Laterality: Right  Location/Site:  L3-4  Needle: 3.5 in., 20 ga. Tuohy  Needle Placement: Paramedian epidural  Findings:   -Comments: Excellent flow of contrast into the epidural space.  Procedure Details: Using a paramedian approach from the side mentioned above, the region overlying the inferior lamina was localized under fluoroscopic visualization and the soft tissues overlying this structure were infiltrated with 4 ml. of 1% Lidocaine without Epinephrine. The Tuohy needle was inserted into the epidural space using a paramedian approach.   The epidural space was localized using loss of resistance along with counter oblique bi-planar fluoroscopic views.  After negative aspirate for air, blood, and CSF, a 2 ml. volume of Isovue-250 was injected into the epidural space and the flow of contrast was observed. Radiographs were obtained for documentation purposes.    The injectate was administered into the level noted above.   Additional Comments:  No complications occurred Dressing: 2 x 2 sterile gauze and Band-Aid    Post-procedure details: Patient was observed during the procedure. Post-procedure instructions were reviewed.  Patient left the clinic in stable condition.   Clinical History: Narrative & Impression CLINICAL DATA:  Low back pain for 2-3 years.  Right-sided weakness.   EXAM: MRI LUMBAR SPINE WITHOUT CONTRAST   TECHNIQUE: Multiplanar, multisequence MR imaging of the lumbar spine was performed. No intravenous contrast was administered.   COMPARISON:  None Available.   FINDINGS: Segmentation:  Standard.   Alignment:  Physiologic.   Vertebrae:  No acute fracture, evidence of discitis, or aggressive bone lesion.   Conus medullaris and cauda equina: Conus extends to the L1 level. Conus and cauda equina appear normal.    Paraspinal and other soft tissues: No acute paraspinal abnormality. Large partially visualized left renal cyst.   Disc levels:   Disc spaces: Degenerative disease with disc height loss at T11-12, T12-L1, L4-5 and L5-S1.   T12-L1: Mild broad-based disc bulge. Mild bilateral facet arthropathy. Mild left foraminal stenosis. Moderate right foraminal stenosis. No spinal stenosis.   L1-L2: Mild broad-based disc bulge. Moderate bilateral facet arthropathy. No right foraminal stenosis. Mild left foraminal stenosis. No spinal stenosis.   L2-L3: Mild broad-based disc bulge. Moderate bilateral facet arthropathy, right worse than left. Minimal spinal stenosis. Mild-moderate bilateral foraminal stenosis.   L3-L4: Broad-based disc bulge. Mild bilateral facet arthropathy. No spinal stenosis. Moderate bilateral foraminal stenosis.   L4-L5: Broad-based disc bulge with a left subarticular disc protrusion with possible impingement of the left intraspinal L5 nerve root. Left subarticular recess stenosis. Moderate-severe left foraminal stenosis. Moderate right foraminal stenosis. No spinal stenosis.   L5-S1: Minimal broad-based disc bulge. No foraminal or central canal stenosis.   IMPRESSION: 1. Lumbar spine spondylosis as described above. 2. No acute osseous injury of the lumbar spine.     Electronically Signed   By: Elige Ko M.D.   On: 11/16/2022 08:58     Objective:  VS:  HT:    WT:   BMI:     BP:(!) 159/81  HR:72bpm  TEMP: ( )  RESP:  Physical Exam Vitals and nursing note reviewed.  Constitutional:      General: He is not in acute distress.    Appearance: Normal appearance. He is not ill-appearing.  HENT:     Head: Normocephalic and atraumatic.     Right Ear: External ear normal.     Left Ear: External ear normal.     Nose: No congestion.  Eyes:     Extraocular Movements: Extraocular movements intact.  Cardiovascular:     Rate and Rhythm: Normal rate.     Pulses:  Normal pulses.  Pulmonary:     Effort: Pulmonary effort is normal. No respiratory distress.  Abdominal:     General: There is no distension.     Palpations: Abdomen is soft.  Musculoskeletal:        General: No tenderness or signs of injury.     Cervical back: Neck supple.     Right lower leg: No edema.     Left lower leg: No edema.     Comments: Patient has good distal strength without clonus.  Skin:    Findings: No erythema or rash.  Neurological:     General: No focal deficit present.     Mental Status: He is alert and oriented to person, place, and time.     Sensory: No sensory deficit.     Motor: No weakness or abnormal muscle tone.     Coordination: Coordination normal.  Psychiatric:        Mood and Affect: Mood normal.        Behavior: Behavior normal.      Imaging: No results found.

## 2022-12-11 NOTE — Procedures (Signed)
Lumbar Epidural Steroid Injection - Interlaminar Approach with Fluoroscopic Guidance  Patient: Jason Barber      Date of Birth: October 26, 1950 MRN: [id number] PCP: [facility]      Visit Date: 11/27/2022   Universal Protocol:     Consent Given By: the patient  Position: PRONE  Additional Comments: Vital signs were monitored before and after the procedure. Patient was prepped and draped in the usual sterile fashion. The correct patient, procedure, and site was verified.   Injection Procedure Details:   Procedure diagnoses: Lumbar radiculopathy [M54.16]   Meds Administered:  Meds ordered this encounter  Medications   methylPREDNISolone acetate (DEPO-MEDROL) injection 40 mg     Laterality: Right  Location/Site:  L3-4  Needle: 3.5 in., 20 ga. Tuohy  Needle Placement: Paramedian epidural  Findings:   -Comments: Excellent flow of contrast into the epidural space.  Procedure Details: Using a paramedian approach from the side mentioned above, the region overlying the inferior lamina was localized under fluoroscopic visualization and the soft tissues overlying this structure were infiltrated with 4 ml. of 1% Lidocaine without Epinephrine. The Tuohy needle was inserted into the epidural space using a paramedian approach.   The epidural space was localized using loss of resistance along with counter oblique bi-planar fluoroscopic views.  After negative aspirate for air, blood, and CSF, a 2 ml. volume of Isovue-250 was injected into the epidural space and the flow of contrast was observed. Radiographs were obtained for documentation purposes.    The injectate was administered into the level noted above.   Additional Comments:  No complications occurred Dressing: 2 x 2 sterile gauze and Band-Aid    Post-procedure details: Patient was observed during the procedure. Post-procedure instructions were reviewed.  Patient left the clinic in stable  condition.

## 2023-03-04 ENCOUNTER — Other Ambulatory Visit (INDEPENDENT_AMBULATORY_CARE_PROVIDER_SITE_OTHER): Payer: No Typology Code available for payment source

## 2023-03-04 ENCOUNTER — Ambulatory Visit (INDEPENDENT_AMBULATORY_CARE_PROVIDER_SITE_OTHER): Payer: No Typology Code available for payment source | Admitting: Orthopaedic Surgery

## 2023-03-04 ENCOUNTER — Encounter: Payer: Self-pay | Admitting: Orthopaedic Surgery

## 2023-03-04 DIAGNOSIS — Z96651 Presence of right artificial knee joint: Secondary | ICD-10-CM | POA: Diagnosis not present

## 2023-03-04 NOTE — Progress Notes (Signed)
The patient is an active 73 year old gentleman well-known to [place].  We replaced his right knee in April 2024.  We have replaced his right hip as well.  He has no issues of the left side but his right hip hurts over the trochanteric area and his right knee hurts over the IT band area.  He does walk quite a bit and does a lot of hunting as well.  He does have really good range of motion of his right hip and his right knee but they definitely hurt over the trochanteric area and the IT band and all along the right side of his thigh.  His right hip has no blocks to motion in his right knee motion is good that is slightly stiff and there is some swelling to be expected.  His left hip and left knee exams are normal.  Standing AP and lateral of the right knee shows a well-seated press-fit total knee arthroplasty with no complicating features.  At this point I would like to send him to outpatient physical therapy to work specifically over the right hip IT band, trochanteric area and the right knee IT band.  Any modalities per the therapist discretion.  Will see him back in 8 weeks to see how he is doing overall but no x-rays are needed.

## 2023-03-21 ENCOUNTER — Other Ambulatory Visit: Payer: Self-pay

## 2023-03-21 ENCOUNTER — Telehealth: Payer: Self-pay

## 2023-03-21 DIAGNOSIS — Z96651 Presence of right artificial knee joint: Secondary | ICD-10-CM

## 2023-03-21 NOTE — Telephone Encounter (Signed)
 sent

## 2023-03-21 NOTE — Telephone Encounter (Signed)
 [facility] called to get a new PT order faxed to them (last one was from 06/2002. No referral has been placed in the chart yet.

## 2023-05-01 ENCOUNTER — Ambulatory Visit (INDEPENDENT_AMBULATORY_CARE_PROVIDER_SITE_OTHER): Payer: Medicare Other | Admitting: Orthopaedic Surgery

## 2023-05-01 ENCOUNTER — Encounter: Payer: Self-pay | Admitting: Orthopaedic Surgery

## 2023-05-01 DIAGNOSIS — Z96651 Presence of right artificial knee joint: Secondary | ICD-10-CM | POA: Diagnosis not present

## 2023-05-01 NOTE — Progress Notes (Signed)
 The patient is now 11 months status post a right total knee replacement.  We did x-ray his knee 2 months ago and the replacement was in good position.  He was dealing with some IT band pain at the knee as well as the hip.  He says that is getting better but he still has pain in the lateral aspect of his left knee.  He is very active at 72.  His biggest problem is that is going up and down stairs as well as big inclines and hills.  On exam the right knee shows good range of motion and is ligamentously stable.  There is some irritation around the distal IT band laterally to the knee.  There is a slight crepitation in that area.  I would like him to try Voltaren gel to this area and continue questioning the exercises.  He can apply Voltaren gel 2-3 times a day.  The next time we need to see him back for now for 6 months unless he is having issues.  All like a standing AP and lateral of his right knee at that visit.  We can always place a steroid injection over the distal IT band then if needed.

## 2023-12-16 ENCOUNTER — Encounter: Payer: Self-pay | Admitting: Radiology
